# Patient Record
Sex: Female | Born: 1950 | Race: White | Hispanic: No | Marital: Married | Smoking: Never smoker
Health system: Southern US, Community
[De-identification: ages and names within clinical notes are randomized; demographics above are authoritative.]

## PROBLEM LIST (undated history)

## (undated) DIAGNOSIS — R1310 Dysphagia, unspecified: Secondary | ICD-10-CM

## (undated) DIAGNOSIS — D649 Anemia, unspecified: Secondary | ICD-10-CM

## (undated) DIAGNOSIS — K219 Gastro-esophageal reflux disease without esophagitis: Secondary | ICD-10-CM

## (undated) DIAGNOSIS — Z9889 Other specified postprocedural states: Secondary | ICD-10-CM

## (undated) DIAGNOSIS — F329 Major depressive disorder, single episode, unspecified: Secondary | ICD-10-CM

## (undated) DIAGNOSIS — R112 Nausea with vomiting, unspecified: Secondary | ICD-10-CM

## (undated) DIAGNOSIS — E7800 Pure hypercholesterolemia, unspecified: Secondary | ICD-10-CM

## (undated) DIAGNOSIS — M797 Fibromyalgia: Secondary | ICD-10-CM

## (undated) DIAGNOSIS — M1990 Unspecified osteoarthritis, unspecified site: Secondary | ICD-10-CM

## (undated) DIAGNOSIS — H269 Unspecified cataract: Secondary | ICD-10-CM

## (undated) DIAGNOSIS — Z5189 Encounter for other specified aftercare: Secondary | ICD-10-CM

## (undated) DIAGNOSIS — F32A Depression, unspecified: Secondary | ICD-10-CM

## (undated) DIAGNOSIS — K589 Irritable bowel syndrome without diarrhea: Secondary | ICD-10-CM

## (undated) DIAGNOSIS — K5790 Diverticulosis of intestine, part unspecified, without perforation or abscess without bleeding: Secondary | ICD-10-CM

## (undated) DIAGNOSIS — C801 Malignant (primary) neoplasm, unspecified: Secondary | ICD-10-CM

## (undated) DIAGNOSIS — K259 Gastric ulcer, unspecified as acute or chronic, without hemorrhage or perforation: Secondary | ICD-10-CM

## (undated) HISTORY — DX: Encounter for other specified aftercare: Z51.89

## (undated) HISTORY — DX: Pure hypercholesterolemia, unspecified: E78.00

## (undated) HISTORY — DX: Unspecified cataract: H26.9

## (undated) HISTORY — DX: Unspecified osteoarthritis, unspecified site: M19.90

## (undated) HISTORY — DX: Depression, unspecified: F32.A

## (undated) HISTORY — DX: Anemia, unspecified: D64.9

## (undated) HISTORY — PX: DILATION AND CURETTAGE OF UTERUS: SHX78

## (undated) HISTORY — PX: FOOT SURGERY: SHX648

## (undated) HISTORY — DX: Fibromyalgia: M79.7

## (undated) HISTORY — DX: Malignant (primary) neoplasm, unspecified: C80.1

## (undated) HISTORY — DX: Gastro-esophageal reflux disease without esophagitis: K21.9

## (undated) HISTORY — DX: Dysphagia, unspecified: R13.10

## (undated) HISTORY — DX: Irritable bowel syndrome, unspecified: K58.9

## (undated) HISTORY — DX: Gastric ulcer, unspecified as acute or chronic, without hemorrhage or perforation: K25.9

## (undated) HISTORY — PX: BLEPHAROPLASTY: SUR158

## (undated) HISTORY — DX: Diverticulosis of intestine, part unspecified, without perforation or abscess without bleeding: K57.90

---
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
## 2008-09-20 HISTORY — PX: COLONOSCOPY: SHX174

## 2016-02-24 ENCOUNTER — Encounter (INDEPENDENT_AMBULATORY_CARE_PROVIDER_SITE_OTHER): Payer: Self-pay | Admitting: Internal Medicine

## 2016-02-27 ENCOUNTER — Ambulatory Visit (INDEPENDENT_AMBULATORY_CARE_PROVIDER_SITE_OTHER): Payer: Medicare Other | Admitting: Internal Medicine

## 2016-02-27 ENCOUNTER — Encounter (INDEPENDENT_AMBULATORY_CARE_PROVIDER_SITE_OTHER): Payer: Self-pay | Admitting: Internal Medicine

## 2016-02-27 ENCOUNTER — Encounter (INDEPENDENT_AMBULATORY_CARE_PROVIDER_SITE_OTHER): Payer: Self-pay | Admitting: *Deleted

## 2016-02-27 ENCOUNTER — Other Ambulatory Visit (INDEPENDENT_AMBULATORY_CARE_PROVIDER_SITE_OTHER): Payer: Self-pay | Admitting: Internal Medicine

## 2016-02-27 VITALS — BP 112/60 | HR 64 | Temp 98.0°F | Ht 62.0 in | Wt 140.2 lb

## 2016-02-27 DIAGNOSIS — R1310 Dysphagia, unspecified: Secondary | ICD-10-CM

## 2016-02-27 DIAGNOSIS — M1990 Unspecified osteoarthritis, unspecified site: Secondary | ICD-10-CM | POA: Insufficient documentation

## 2016-02-27 NOTE — Progress Notes (Addendum)
   Subjective:    Patient ID: Shelby Andersen, female    DOB: 1951-08-11, 65 y.o.   MRN: [id number]  HPI Referred by [facility] for dysphagia.  She tells me in January she had a sinus infection and ever since she says she feels a glob in her upper esophagus. She says she choke very easy. If she swallows a pill, she massages her throat.  Her appetite is good. She can eat anything she wants. She has no problems eating meats . 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 Rule out esophageal web verus hypertonic cricopharyngeus muscle. 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 Correlate with upper endoscopy.  Hx of prior EGD/ED in the past by Dr. West Carbo per records. Her last colonoscopy was in 2010 and was normal by Dr .West Carbo.   02/04/2016 Laryngoscopy: Normal.   08/08/2009 Colonoscopy: Screening: Dr. West Carbo: Normal.   Review of Systems Past Medical History  Diagnosis Date  . Osteoarthritis   . Dysphagia     Past Surgical History  Procedure Laterality Date  . Dilation and curettage of uterus    . Foot surgery      Allergies  Allergen Reactions  . Codeine     itching    No current outpatient prescriptions on file prior to visit.   No current facility-administered medications on file prior to visit.   Current Outpatient Prescriptions  Medication Sig Dispense Refill  . atorvastatin (LIPITOR) 20 MG tablet Take 20 mg by mouth daily.    . citalopram (CELEXA) 40 MG tablet Take 40 mg by mouth daily.    . cyclobenzaprine (FLEXERIL) 10 MG tablet Take 10 mg by mouth daily.    . meloxicam (MOBIC) 15 MG tablet Take 15 mg by mouth daily.    . pantoprazole (PROTONIX) 40 MG tablet Take 40 mg by mouth daily.    . predniSONE (DELTASONE) 5 MG tablet Take 5 mg by mouth daily with breakfast.    . tolterodine (DETROL) 2 MG tablet Take 2 mg by mouth 2 (two) times daily.    . traZODone (DESYREL) 50 MG tablet Take  50 mg by mouth at bedtime.     No current facility-administered medications for this visit.   Mybertriq daily.      Objective:   Physical Exam Blood pressure 112/60, pulse 64, temperature 98 F (36.7 C), height 5\' 2"  (1.575 m), weight 140 lb 3.2 oz (63.594 kg). Alert and oriented. Skin warm and dry. Oral mucosa is moist.   . Sclera anicteric, conjunctivae is pink. Thyroid not enlarged. No cervical lymphadenopathy. Lungs clear. Heart regular rate and rhythm.  Abdomen is soft. Bowel sounds are positive. No hepatomegaly. No abdominal masses felt. No tenderness.  No edema to lower extremities. Patient is alert and oriented.        Assessment & Plan:  Dysphagia. Nees EGD/ED. Possible webb

## 2016-02-27 NOTE — Patient Instructions (Signed)
The risks and benefits such as perforation, bleeding, and infection were reviewed with the patient and is agreeable. 

## 2016-03-01 ENCOUNTER — Encounter (INDEPENDENT_AMBULATORY_CARE_PROVIDER_SITE_OTHER): Payer: Self-pay

## 2016-04-14 DIAGNOSIS — M797 Fibromyalgia: Secondary | ICD-10-CM | POA: Insufficient documentation

## 2016-04-21 ENCOUNTER — Encounter (HOSPITAL_COMMUNITY): Payer: Self-pay | Admitting: *Deleted

## 2016-04-21 ENCOUNTER — Encounter (HOSPITAL_COMMUNITY)
Admission: RE | Disposition: A | Discharge status: Home / Self Care | Payer: Self-pay | Source: Ambulatory Visit | Attending: Internal Medicine

## 2016-04-21 ENCOUNTER — Ambulatory Visit (HOSPITAL_COMMUNITY)
Admission: RE | Admit: 2016-04-21 | Discharge: 2016-04-21 | Disposition: A | Discharge status: Home / Self Care | Payer: Medicare Other | Source: Ambulatory Visit | Attending: Internal Medicine | Admitting: Internal Medicine

## 2016-04-21 DIAGNOSIS — Z791 Long term (current) use of non-steroidal anti-inflammatories (NSAID): Secondary | ICD-10-CM | POA: Diagnosis not present

## 2016-04-21 DIAGNOSIS — R1314 Dysphagia, pharyngoesophageal phase: Secondary | ICD-10-CM | POA: Diagnosis not present

## 2016-04-21 DIAGNOSIS — R1310 Dysphagia, unspecified: Secondary | ICD-10-CM | POA: Insufficient documentation

## 2016-04-21 DIAGNOSIS — K449 Diaphragmatic hernia without obstruction or gangrene: Secondary | ICD-10-CM | POA: Diagnosis not present

## 2016-04-21 DIAGNOSIS — Z79899 Other long term (current) drug therapy: Secondary | ICD-10-CM | POA: Diagnosis not present

## 2016-04-21 DIAGNOSIS — K319 Disease of stomach and duodenum, unspecified: Secondary | ICD-10-CM | POA: Insufficient documentation

## 2016-04-21 DIAGNOSIS — M1990 Unspecified osteoarthritis, unspecified site: Secondary | ICD-10-CM | POA: Insufficient documentation

## 2016-04-21 DIAGNOSIS — K219 Gastro-esophageal reflux disease without esophagitis: Secondary | ICD-10-CM | POA: Diagnosis not present

## 2016-04-21 DIAGNOSIS — K3189 Other diseases of stomach and duodenum: Secondary | ICD-10-CM | POA: Diagnosis not present

## 2016-04-21 HISTORY — DX: Other specified postprocedural states: Z98.890

## 2016-04-21 HISTORY — PX: ESOPHAGOGASTRODUODENOSCOPY: SHX5428

## 2016-04-21 HISTORY — DX: Other specified postprocedural states: R11.2

## 2016-04-21 HISTORY — PX: ESOPHAGEAL DILATION: SHX303

## 2016-04-21 SURGERY — EGD (ESOPHAGOGASTRODUODENOSCOPY)
Anesthesia: Moderate Sedation

## 2016-04-21 MED ORDER — SIMETHICONE 40 MG/0.6ML PO SUSP
ORAL | Status: DC | PRN
  Administered 2016-04-21: 2.5 mL

## 2016-04-21 MED ORDER — BUTAMBEN-TETRACAINE-BENZOCAINE 2-2-14 % EX AERO
INHALATION_SPRAY | CUTANEOUS | Status: DC | PRN
  Administered 2016-04-21: 2 via TOPICAL

## 2016-04-21 MED ORDER — MEPERIDINE HCL 50 MG/ML IJ SOLN
INTRAMUSCULAR | Status: DC
Start: 2016-04-21 — End: 2016-04-21
  Filled 2016-04-21: qty 1

## 2016-04-21 MED ORDER — MIDAZOLAM HCL 5 MG/5ML IJ SOLN
INTRAMUSCULAR | Status: AC
  Filled 2016-04-21: qty 10

## 2016-04-21 MED ORDER — MEPERIDINE HCL 50 MG/ML IJ SOLN
INTRAMUSCULAR | Status: DC | PRN
  Administered 2016-04-21 (×2): 25 mg via INTRAVENOUS

## 2016-04-21 MED ORDER — SODIUM CHLORIDE 0.9 % IV SOLN
INTRAVENOUS | Status: DC
  Administered 2016-04-21: 1000 mL via INTRAVENOUS

## 2016-04-21 MED ORDER — MIDAZOLAM HCL 5 MG/5ML IJ SOLN
INTRAMUSCULAR | Status: DC | PRN
  Administered 2016-04-21 (×2): 1 mg via INTRAVENOUS
  Administered 2016-04-21 (×2): 2 mg via INTRAVENOUS

## 2016-04-21 NOTE — Discharge Instructions (Signed)
No aspirin for 24 hours. Resume usual medications and diet as before. No driving for 24 hours. Physician will call with biopsy results.       Esophagogastroduodenoscopy, Care After Refer to this sheet in the next few weeks. These instructions provide you with information about caring for yourself after your procedure. Your health care provider may also give you more specific instructions. Your treatment has been planned according to current medical practices, but problems sometimes occur. Call your health care provider if you have any problems or questions after your procedure. WHAT TO EXPECT AFTER THE PROCEDURE After your procedure, it is typical to feel:  Soreness in your throat.  Pain with swallowing.  Sick to your stomach (nauseous).  Bloated.  Dizzy.  Fatigued. HOME CARE INSTRUCTIONS  Do not eat or drink anything until the numbing medicine (local anesthetic) has worn off and your gag reflex has returned. You will know that the local anesthetic has worn off when you can swallow comfortably.  Do not drive or operate machinery until directed by your health care provider.  Take medicines only as directed by your health care provider. SEEK MEDICAL CARE IF:   You cannot stop coughing.  You are not urinating at all or less than usual. SEEK IMMEDIATE MEDICAL CARE IF:  You have difficulty swallowing.  You cannot eat or drink.  You have worsening throat or chest pain.  You have dizziness or lightheadedness or you faint.  You have nausea or vomiting.  You have chills.  You have a fever.  You have severe abdominal pain.  You have black, tarry, or bloody stools.   This information is not intended to replace advice given to you by your health care provider. Make sure you discuss any questions you have with your health care provider.   Document Released: 08/23/2012 Document Revised: 09/27/2014 Document Reviewed: 08/23/2012 Elsevier Interactive Patient Education  [employer].

## 2016-04-21 NOTE — Op Note (Addendum)
[facility] Patient Name: Shelby Andersen Procedure Date: 04/21/2016 2:16 PM MRN: [id number] Date of Birth: 15-Jul-1951 Attending MD: Hildred Laser , MD CSN: [id number] Age: 65 Admit Type: Outpatient Procedure:                Upper GI endoscopy Indications:              Esophageal dysphagia, Gastro-esophageal reflux                            disease Providers:                Hildred Laser, MD, Lurline Del, RN, Purcell Nails.                            Tina Griffiths, Technician Referring MD:             Ms. Marval Regal Ronn Melena, NP. [facility]                            [facility] Medicines:                Cetacaine spray, Meperidine 50 mg IV, Midazolam 6                            mg IV Complications:            No immediate complications. Estimated Blood Loss:     Estimated blood loss was minimal. Procedure:                Pre-Anesthesia Assessment:                           - Prior to the procedure, a History and Physical                            was performed, and patient medications and                            allergies were reviewed. The patient's tolerance of                            previous anesthesia was also reviewed. The risks                            and benefits of the procedure and the sedation                            options and risks were discussed with the patient.                            All questions were answered, and informed consent                            was obtained. Prior Anticoagulants: The patient                            last took previous  NSAID medication 1 day prior to                            the procedure. ASA Grade Assessment: II - A patient                            with mild systemic disease. After reviewing the                            risks and benefits, the patient was deemed in                            satisfactory condition to undergo the procedure.                           After obtaining informed consent, the  endoscope was                            passed under direct vision. Throughout the                            procedure, the patient's blood pressure, pulse, and                            oxygen saturations were monitored continuously. The                            [id number]) scope was introduced through the                            mouth, and advanced to the second part of duodenum.                            The upper GI endoscopy was accomplished without                            difficulty. The patient tolerated the procedure                            well. Scope In: 2:29:49 PM Scope Out: 2:44:34 PM Total Procedure Duration: 0 hours 14 minutes 45 seconds  Findings:      The Z-line was regular and was found 34 cm from the incisors.      A 2 cm hiatal hernia was present.      No endoscopic abnormality was evident in the esophagus to explain the       patient's complaint of dysphagia. It was decided, however, to proceed       with dilation of the entire esophagus. The dilation site was examined       following endoscope reinsertion and showed no change and no bleeding,       mucosal tear or perforation. Biopsies were taken with a cold forceps for       histology.      A few, small non-bleeding erosions were found in the prepyloric region  of the stomach. There were no stigmata of recent bleeding.      The exam of the stomach was otherwise normal.      The duodenal bulb and second portion of the duodenum were normal.      No endoscopic abnormality was evident in the esophagus to explain the       patient's complaint of dysphagia. It was decided, however, to proceed       with dilation of the entire esophagus. The scope was withdrawn. Dilation       was performed with a Maloney dilator with mild resistance at 82 Fr. The       dilation site was examined and showed no change. Impression:               - Z-line regular, 34 cm from the incisors.                            - 2 cm hiatal hernia.                           - No endoscopic esophageal abnormality to explain                            patient's dysphagia. Esophagus dilated. Biopsied.                           - Non-bleeding erosive gastropathy possibly                            secondary to meloxicam.                           - Normal duodenal bulb and second portion of the                            duodenum.                           comment: If esophageal biopsy is negative for                            eosinophilophagitis and she remains with dysphagia                            will proceed with further evaluation. Moderate Sedation:      Moderate (conscious) sedation was administered by the endoscopy nurse       and supervised by the endoscopist. The following parameters were       monitored: oxygen saturation, heart rate, blood pressure, CO2       capnography and response to care. Total physician intraservice time was       20 minutes. Recommendation:           - Patient has a contact number available for                            emergencies. The signs and symptoms of potential  delayed complications were discussed with the                            patient. Return to normal activities tomorrow.                            Written discharge instructions were provided to the                            patient.                           - Resume previous diet today.                           - Continue present medications.                           - Resume previous NSAID medication at prior dose                            tomorrow.                           - Await pathology results. Procedure Code(s):        --- Professional ---                           [phone], Esophagogastroduodenoscopy, flexible,                            transoral; with biopsy, single or multiple                           99152, Moderate sedation services provided by the                             same physician or other qualified health care                            professional performing the diagnostic or                            therapeutic service that the sedation supports,                            requiring the presence of an independent trained                            observer to assist in the monitoring of the                            patient's level of consciousness and physiological                            status; initial 15 minutes of intraservice time,  patient age 55 years or older Diagnosis Code(s):        --- Professional ---                           K44.9, Diaphragmatic hernia without obstruction or                            gangrene                           K31.89, Other diseases of stomach and duodenum                           R13.14, Dysphagia, pharyngoesophageal phase                           K21.9, Gastro-esophageal reflux disease without                            esophagitis CPT copyright 2016 American Medical Association. All rights reserved. The codes documented in this report are preliminary and upon coder review may  be revised to meet current compliance requirements. Hildred Laser, MD Hildred Laser, MD 04/21/2016 3:03:13 PM This report has been signed electronically. Number of Addenda: 0

## 2016-04-21 NOTE — H&P (Signed)
Shelby Andersen is an 65 y.o. female.   Chief Complaint: Patient is here for EGD and ED. HPI: Patient is 65 year old Caucasian female was chronic GERD who now presents with dysphagia to solids and pills and occasionally liquids. She states her husband did Heimlich maneuver once and it worked. Heartburn is well controlled with PPI. She may have an episode every now and then. She denies nausea vomiting abdominal pain or melena. Last EGD was 7 years ago and provider relief for several months if not years.  Past Medical History:  Diagnosis Date  . Dysphagia   . Osteoarthritis   . PONV (postoperative nausea and vomiting)     Past Surgical History:  Procedure Laterality Date  . DILATION AND CURETTAGE OF UTERUS    . FOOT SURGERY      History reviewed. No pertinent family history. Social History:  reports that she has never smoked. She has never used smokeless tobacco. She reports that she drinks alcohol. She reports that she does not use drugs.  Allergies:  Allergies  Allergen Reactions  . Codeine     itching    Medications Prior to Admission  Medication Sig Dispense Refill  . atorvastatin (LIPITOR) 20 MG tablet Take 20 mg by mouth daily.    . Cholecalciferol (VITAMIN D PO) Take 1 tablet by mouth daily.    . citalopram (CELEXA) 40 MG tablet Take 40 mg by mouth daily.    . cyclobenzaprine (FLEXERIL) 10 MG tablet Take 10 mg by mouth daily.    Marland Kitchen docusate sodium (COLACE) 100 MG capsule Take 100 mg by mouth daily.    Marland Kitchen L-LYSINE PO Take 1 capsule by mouth daily.    . meloxicam (MOBIC) 15 MG tablet Take 15 mg by mouth daily.    . mirabegron ER (MYRBETRIQ) 25 MG TB24 tablet Take 25 mg by mouth daily.    . Multiple Vitamins-Minerals (MULTIVITAMINS THER. W/MINERALS) TABS tablet Take 1 tablet by mouth daily.    . pantoprazole (PROTONIX) 40 MG tablet Take 40 mg by mouth daily.    . predniSONE (DELTASONE) 5 MG tablet Take 5 mg by mouth. Every 3rd day    . tolterodine (DETROL) 2 MG tablet Take 2  mg by mouth at bedtime.     . traZODone (DESYREL) 50 MG tablet Take 50 mg by mouth at bedtime.      No results found for this or any previous visit (from the past 48 hour(s)). No results found.  ROS  Blood pressure 135/76, pulse 73, temperature 98.3 F (36.8 C), temperature source Oral, resp. rate 14, height 5\' 2"  (1.575 m), weight 139 lb (63 kg), SpO2 100 %. Physical Exam  Constitutional: She appears well-developed and well-nourished.  HENT:  Mouth/Throat: Oropharynx is clear and moist.  Eyes: Conjunctivae are normal. No scleral icterus.  Neck: No thyromegaly present.  Cardiovascular: Normal rate, regular rhythm and normal heart sounds.   No murmur heard. Respiratory: Effort normal and breath sounds normal.  GI: Soft. She exhibits no distension and no mass. There is no tenderness.  Musculoskeletal: She exhibits no edema.  Lymphadenopathy:    She has no cervical adenopathy.  Neurological: She is alert.  Skin: Skin is warm and dry.     Assessment/Plan Dysphagia to solids and pills in a patient with chronic GERD. EGD with ED.  Hildred Laser, MD 04/21/2016, 2:19 PM

## 2016-04-23 NOTE — Progress Notes (Signed)
Postoperative follow up call to patient.  C/o "Wednesday night after procedure, I had severe indigestion, pain that radiated to my back.  None today.  Feeling much better".  Patient did not report to Dr. Laural Golden or MD oncall.  Dr. Laural Golden notified.

## 2016-04-23 NOTE — Progress Notes (Signed)
Patient reassured after discussing with Dr. Laural Golden.  Patient advised per Dr. Laural Golden, after dilation reflux can be a little worse.  She will monitor symptoms. If recurs, patient will call and report.

## 2016-05-05 ENCOUNTER — Encounter (HOSPITAL_COMMUNITY): Payer: Self-pay | Admitting: Internal Medicine

## 2016-05-20 ENCOUNTER — Other Ambulatory Visit (INDEPENDENT_AMBULATORY_CARE_PROVIDER_SITE_OTHER): Payer: Self-pay | Admitting: *Deleted

## 2016-05-21 ENCOUNTER — Other Ambulatory Visit (INDEPENDENT_AMBULATORY_CARE_PROVIDER_SITE_OTHER): Payer: Self-pay | Admitting: *Deleted

## 2016-05-21 DIAGNOSIS — K2960 Other gastritis without bleeding: Secondary | ICD-10-CM

## 2016-11-01 ENCOUNTER — Telehealth (INDEPENDENT_AMBULATORY_CARE_PROVIDER_SITE_OTHER): Payer: Self-pay | Admitting: Internal Medicine

## 2016-11-01 NOTE — Telephone Encounter (Signed)
Patient called, stated that Dr. Laural Golden did an endoscopy a few months ago, he wanted her to be checked for h pylori.  She stated that she finally got in with a new primary doctor and they did the stool test, but will not give her results, said they sent them to Dr. Laural Golden and that he'll have to give her those results.  [phone] or [phone]

## 2016-11-02 NOTE — Telephone Encounter (Signed)
Dr.Rehman was mae aware. He ask that we get the results form her PCP. Stool was checked for H- pylori.

## 2016-11-04 NOTE — Telephone Encounter (Signed)
I was able to get the results faxed to [place] yesterday and I have them to [place].

## 2016-11-16 ENCOUNTER — Encounter (INDEPENDENT_AMBULATORY_CARE_PROVIDER_SITE_OTHER): Payer: Self-pay

## 2017-07-22 ENCOUNTER — Ambulatory Visit (INDEPENDENT_AMBULATORY_CARE_PROVIDER_SITE_OTHER): Payer: Medicare Other | Admitting: Internal Medicine

## 2017-07-22 ENCOUNTER — Encounter (INDEPENDENT_AMBULATORY_CARE_PROVIDER_SITE_OTHER): Payer: Self-pay | Admitting: Internal Medicine

## 2017-07-22 ENCOUNTER — Encounter (INDEPENDENT_AMBULATORY_CARE_PROVIDER_SITE_OTHER): Payer: Self-pay

## 2017-07-22 VITALS — BP 122/62 | HR 64 | Temp 97.9°F | Ht 62.5 in | Wt 133.7 lb

## 2017-07-22 DIAGNOSIS — R197 Diarrhea, unspecified: Secondary | ICD-10-CM

## 2017-07-22 MED ORDER — METRONIDAZOLE 500 MG PO TABS: 500.0000 mg | ORAL_TABLET | Freq: Two times a day (BID) | ORAL | 0 refills | Status: DC

## 2017-07-22 NOTE — Progress Notes (Signed)
Subjective:    Patient ID: Shelby Andersen, female    DOB: [birth year], 66 y.o.   MRN: [id number]  HPI Here today for f/u. Last seen in 2017 by me. Her last colonoscopy was in 2010 (screening) by Dr. West Carbo and was normal.  She tells me she is having 10-12 stools a day. She says she has not had a normal stool in the past 3 months.  She is wearing depends. All of her stools are loose. Has been taking Pepto Bismol as needed. She has seen blood when she wiped.  Her stools are blackish-green. No recent antibiotics. No fever.  She says she aches all over at times.   Underwent and EGD in August of 2017 for dyshagia: Impression:               - Z-line regular, 34 cm from the incisors.                           - 2 cm hiatal hernia.                           - No endoscopic esophageal abnormality to explain                            patient's dysphagia. Esophagus dilated. Biopsied.                           - Non-bleeding erosive gastropathy possibly                            secondary to meloxicam.                           - Normal duodenal bulb and second portion of the                            duodenum.                           comment: If esophageal biopsy is negative for                            eosinophilophagitis and she remains with dysphagia                            will proceed with further evaluation. Biopsy negative for EOE.   Review of Systems Past Medical History:  Diagnosis Date  . Dysphagia   . Osteoarthritis   . PONV (postoperative nausea and vomiting)     Past Surgical History:  Procedure Laterality Date  . DILATION AND CURETTAGE OF UTERUS    . ESOPHAGEAL DILATION N/A 04/21/2016   Procedure: ESOPHAGEAL DILATION;  Surgeon: Rogene Houston, MD;  Location: [facility];  Service: Endoscopy;  Laterality: N/A;  . ESOPHAGOGASTRODUODENOSCOPY N/A 04/21/2016   Procedure: ESOPHAGOGASTRODUODENOSCOPY (EGD);  Surgeon: Rogene Houston, MD;  Location: [facility] SU[facility];   Service: Endoscopy;  Laterality: N/A;  2:25  . FOOT SURGERY      Allergies  Allergen Reactions  . Codeine     itching    Current  Outpatient Prescriptions on File Prior to Visit  Medication Sig Dispense Refill  . atorvastatin (LIPITOR) 20 MG tablet Take 20 mg by mouth daily.    . Cholecalciferol (VITAMIN D PO) Take 1 tablet by mouth daily.    . citalopram (CELEXA) 40 MG tablet Take 40 mg by mouth daily.    Marland Kitchen L-LYSINE PO Take 1 capsule by mouth daily.    . Multiple Vitamins-Minerals (MULTIVITAMINS THER. W/MINERALS) TABS tablet Take 1 tablet by mouth daily.    . pantoprazole (PROTONIX) 40 MG tablet Take 40 mg by mouth daily.    Marland Kitchen tolterodine (DETROL) 2 MG tablet Take 2 mg by mouth at bedtime.     . traZODone (DESYREL) 50 MG tablet Take 50 mg by mouth at bedtime.    . meloxicam (MOBIC) 15 MG tablet Take 15 mg by mouth daily.     No current facility-administered medications on file prior to visit.         Objective:   Physical Exam Blood pressure 122/62, pulse 64, temperature 97.9 F (36.6 C), height 5' 2.5" (1.588 m), weight 133 lb 11.2 oz (60.6 kg). Alert and oriented. Skin warm and dry. Oral mucosa is moist.   . Sclera anicteric, conjunctivae is pink. Thyroid not enlarged. No cervical lymphadenopathy. Lungs clear. Heart regular rate and rhythm.  Abdomen is soft. Bowel sounds are positive. No hepatomegaly. No abdominal masses felt. No tenderness.  No edema to lower extremities.           Assessment & Plan:  Diarrhea. Will get a GI pathogen. Will start on Flagyl after the specimen. . Further recommendations to follow.

## 2017-07-22 NOTE — Patient Instructions (Addendum)
GI pathogen.  Rx for Flagyl sent to her pharmacy  

## 2017-07-25 LAB — GASTROINTESTINAL PATHOGEN PANEL PCR
C. DIFFICILE TOX A/B, PCR: NOT DETECTED
CAMPYLOBACTER, PCR: NOT DETECTED
CRYPTOSPORIDIUM, PCR: NOT DETECTED
E COLI (STEC) STX1/STX2, PCR: NOT DETECTED
E coli (ETEC) LT/ST PCR: NOT DETECTED
E coli 0157, PCR: NOT DETECTED
Giardia lamblia, PCR: NOT DETECTED
NOROVIRUS, PCR: NOT DETECTED
ROTAVIRUS, PCR: NOT DETECTED
SALMONELLA, PCR: NOT DETECTED
Shigella, PCR: NOT DETECTED

## 2017-08-01 ENCOUNTER — Telehealth (INDEPENDENT_AMBULATORY_CARE_PROVIDER_SITE_OTHER): Payer: Self-pay | Admitting: Internal Medicine

## 2017-08-01 DIAGNOSIS — R197 Diarrhea, unspecified: Secondary | ICD-10-CM

## 2017-08-01 MED ORDER — DICYCLOMINE HCL 10 MG PO CAPS: 10.0000 mg | ORAL_CAPSULE | Freq: Three times a day (TID) | ORAL | 2 refills | Status: DC

## 2017-08-01 NOTE — Telephone Encounter (Signed)
Rx for Dicyclomine 10mg  TID sent to her pharmacy. Stool studies ar negative.

## 2017-10-31 ENCOUNTER — Other Ambulatory Visit (INDEPENDENT_AMBULATORY_CARE_PROVIDER_SITE_OTHER): Payer: Self-pay | Admitting: Internal Medicine

## 2017-10-31 DIAGNOSIS — R197 Diarrhea, unspecified: Secondary | ICD-10-CM

## 2017-11-28 ENCOUNTER — Telehealth (INDEPENDENT_AMBULATORY_CARE_PROVIDER_SITE_OTHER): Payer: Self-pay | Admitting: Internal Medicine

## 2017-11-28 NOTE — Telephone Encounter (Signed)
She says her stools are formed now. Stools are small but formed.  She says the Dicyclomine has helped

## 2018-12-30 ENCOUNTER — Other Ambulatory Visit (INDEPENDENT_AMBULATORY_CARE_PROVIDER_SITE_OTHER): Payer: Self-pay | Admitting: Internal Medicine

## 2018-12-30 DIAGNOSIS — R197 Diarrhea, unspecified: Secondary | ICD-10-CM

## 2019-03-15 ENCOUNTER — Other Ambulatory Visit: Payer: Self-pay

## 2019-03-15 ENCOUNTER — Other Ambulatory Visit: Payer: Medicare Other

## 2019-03-15 DIAGNOSIS — Z20822 Contact with and (suspected) exposure to covid-19: Secondary | ICD-10-CM

## 2019-03-19 LAB — NOVEL CORONAVIRUS, NAA: SARS-CoV-2, NAA: NOT DETECTED

## 2019-03-27 ENCOUNTER — Telehealth: Payer: Self-pay | Admitting: Family Medicine

## 2019-03-27 NOTE — Telephone Encounter (Signed)
Advised patient her COVID 19 test results are negative.

## 2019-08-19 DIAGNOSIS — K589 Irritable bowel syndrome without diarrhea: Secondary | ICD-10-CM | POA: Insufficient documentation

## 2019-08-19 DIAGNOSIS — K279 Peptic ulcer, site unspecified, unspecified as acute or chronic, without hemorrhage or perforation: Secondary | ICD-10-CM | POA: Insufficient documentation

## 2019-09-07 ENCOUNTER — Encounter: Payer: Self-pay | Admitting: Neurology

## 2019-09-17 ENCOUNTER — Other Ambulatory Visit (INDEPENDENT_AMBULATORY_CARE_PROVIDER_SITE_OTHER): Payer: Medicare Other

## 2019-09-17 ENCOUNTER — Other Ambulatory Visit: Payer: Self-pay

## 2019-09-17 ENCOUNTER — Encounter: Payer: Self-pay | Admitting: Neurology

## 2019-09-17 ENCOUNTER — Ambulatory Visit (INDEPENDENT_AMBULATORY_CARE_PROVIDER_SITE_OTHER): Payer: Medicare Other | Admitting: Neurology

## 2019-09-17 VITALS — BP 130/79 | HR 68 | Ht 62.0 in | Wt 136.0 lb

## 2019-09-17 DIAGNOSIS — R413 Other amnesia: Secondary | ICD-10-CM | POA: Diagnosis not present

## 2019-09-17 DIAGNOSIS — R519 Headache, unspecified: Secondary | ICD-10-CM

## 2019-09-17 DIAGNOSIS — G9389 Other specified disorders of brain: Secondary | ICD-10-CM

## 2019-09-17 DIAGNOSIS — G4453 Primary thunderclap headache: Secondary | ICD-10-CM | POA: Diagnosis not present

## 2019-09-17 MED ORDER — SERTRALINE HCL 100 MG PO TABS: ORAL_TABLET | ORAL | 11 refills | Status: DC

## 2019-09-17 NOTE — Progress Notes (Signed)
NEUROLOGY CONSULTATION NOTE  Shelby Andersen MRN: [id number] DOB: 09-Jun-1951  Referring provider: Dr. Sherrilee Gilles Primary care provider: Dr. Sherrilee Gilles  Reason for consult:  Memory loss  Dear Dr Sallee Provencal:  Thank you for your kind referral of Shelby Andersen for consultation of the above symptoms. Although her history is well known to you, please allow me to reiterate it for the purpose of our medical record. The patient was accompanied to the clinic by her husband who also provides collateral information. Records and images were personally reviewed where available.   HISTORY OF PRESENT ILLNESS: This is a pleasant 68 year old right-handed woman with a history of hyperlipidemia, depression, presenting for evaluation of memory loss and headache. She started noticing memory changes a couple of years ago. She has been concerned that she goes totally blank while carrying on a conversation. She lives with her husband and manages finances without difficulties. She manages both their medications. She denies getting lost driving but has to think of where she is going. Her husband denies any driving concerns. She has not left the stove on. She misplaces her phone frequently. Her son has told her she has repeated the same thing 3 times. Her mother had dementia. No history of significant head injuries or alcohol use. Sleep is not so good, she has difficulty with sleep initiation, in addition she wakes up 3 times to use the bathroom or help her husband when sugar levels go low. She had reduced Trazodone to 25mg  qhs. She states she cries a lot. There have been several deaths in the family since 2016 and her husband has been ill. She cries every morning during her quiet time, missing her mother. Once she gets through it, she is fine the rest of the day. She has been on Sertraline 100mg  daily for the past 2 years. No paranoia or hallucinations. She had been on pantoprazole for GERD which she stopped due to concern  for causing memory loss, no improvement with discontinuation of medication.  She reports a history of 3 migraines in her adult life where she has light sensitivity and extreme nausea. On 07/06/2019, she woke up with excruciating pain different from typical headaches. She had pain on the vertex radiating down and around her head, worse in the occipital region with stiff neck. There was no nausea. She rates the pain as a "15/10" and called her friend to bring her to the hospital. She could not lay flat. Head CT was normal, morphine got the pain down. She states it has been 2 months but her head does not feel normal. There is a pressure around her head, with tenderness in the occipital regions. Neck is better after visiting her chiropractor. She states it is not pain now, just discomfort. No family history of cerebral aneurysm. She gets dizzy quickly with positional changes, such as laying back on bed. No diplopia, dysarthria/dysphagia, anosmia, tremors. She has osteoarthritis with a lot of pain in her left arm, left hip, and left calf. She has pain in her right hand and left wrist radiating to the thumb. No paresthesias. She reports 2 tick bites with a knot on her left leg, she has been told by an allergist that she was "borderline for chronic Lyme disease." She has low back pain worse in the morning. She has bladder incontinence. For the past 5 weeks, she has had diarrhea with 10-12 BM a day, occasional abdominal pain.    PAST MEDICAL HISTORY: Past Medical History:  Diagnosis Date  .  Dysphagia   . Osteoarthritis   . PONV (postoperative nausea and vomiting)     PAST SURGICAL HISTORY: Past Surgical History:  Procedure Laterality Date  . DILATION AND CURETTAGE OF UTERUS    . ESOPHAGEAL DILATION N/A 04/21/2016   Procedure: ESOPHAGEAL DILATION;  Surgeon: Rogene Houston, MD;  [facility];  Service: Endoscopy;  Laterality: N/A;  . ESOPHAGOGASTRODUODENOSCOPY N/A 04/21/2016   Procedure:  ESOPHAGOGASTRODUODENOSCOPY (EGD);  Surgeon: Rogene Houston, MD;  [facility];  Service: Endoscopy;  Laterality: N/A;  2:25  . FOOT SURGERY      MEDICATIONS: Current Outpatient Medications on File Prior to Visit  Medication Sig Dispense Refill  . acetaminophen (TYLENOL) 650 MG CR tablet Take 1,300 mg by mouth every 8 (eight) hours as needed for pain.    Marland Kitchen atorvastatin (LIPITOR) 20 MG tablet Take 20 mg by mouth daily.    . Cholecalciferol (VITAMIN D PO) Take 1 tablet by mouth daily.    . Coenzyme Q10 (COQ10) 200 MG CAPS Take by mouth daily.    Marland Kitchen dicyclomine (BENTYL) 10 MG capsule TAKE 1 CAPSULE BY MOUTH THREE TIMES A DAY BEFORE MEALS 270 capsule 5  . magnesium 30 MG tablet Take 30 mg by mouth 2 (two) times daily.    . Multiple Vitamins-Minerals (MULTIVITAMINS THER. W/MINERALS) TABS tablet Take 1 tablet by mouth daily.    . NON FORMULARY daily. [employer] Bladder Support    . sertraline (ZOLOFT) 100 MG tablet Take 100 mg by mouth daily.    . traZODone (DESYREL) 50 MG tablet Take 50 mg by mouth at bedtime.    . vitamin B-12 (CYANOCOBALAMIN) 1000 MCG tablet Take 3,000 mcg by mouth daily.     No current facility-administered medications on file prior to visit.    ALLERGIES: Allergies  Allergen Reactions  . Codeine     itching    FAMILY HISTORY: History reviewed. No pertinent family history.  SOCIAL HISTORY: Social History   Socioeconomic History  . Marital status: Married    Spouse name: Not on file  . Number of children: Not on file  . Years of education: Not on file  . Highest education level: Not on file  Occupational History  . Not on file  Tobacco Use  . Smoking status: Never Smoker  . Smokeless tobacco: Never Used  Substance and Sexual Activity  . Alcohol use: Yes    Alcohol/week: 0.0 standard drinks    Comment: rare  . Drug use: No  . Sexual activity: Not Currently    Partners: Male  Other Topics Concern  . Not on file  Social History Narrative   Right  handed      Lives with husband in two story home. Stays on first floor      2 years of college edu   Social Determinants of Health   Financial Resource Strain:   . Difficulty of Paying Living Expenses: Not on file  Food Insecurity:   . Worried About Charity fundraiser in the Last Year: Not on file  . Ran Out of Food in the Last Year: Not on file  Transportation Needs:   . Lack of Transportation (Medical): Not on file  . Lack of Transportation (Non-Medical): Not on file  Physical Activity:   . Days of Exercise per Week: Not on file  . Minutes of Exercise per Session: Not on file  Stress:   . Feeling of Stress : Not on file  Social Connections:   .  Frequency of Communication with Friends and Family: Not on file  . Frequency of Social Gatherings with Friends and Family: Not on file  . Attends Religious Services: Not on file  . Active Member of Clubs or Organizations: Not on file  . Attends Archivist Meetings: Not on file  . Marital Status: Not on file  Intimate Partner Violence:   . Fear of Current or Ex-Partner: Not on file  . Emotionally Abused: Not on file  . Physically Abused: Not on file  . Sexually Abused: Not on file    REVIEW OF SYSTEMS: Constitutional: No fevers, chills, or sweats, no generalized fatigue, change in appetite Eyes: No visual changes, double vision, eye pain Ear, nose and throat: No hearing loss, ear pain, nasal congestion, sore throat Cardiovascular: No chest pain, palpitations Respiratory:  No shortness of breath at rest or with exertion, wheezes GastrointestinaI: No nausea, vomiting, diarrhea, abdominal pain, fecal incontinence Genitourinary:  No dysuria, urinary retention or frequency Musculoskeletal:  No neck pain,+ back pain Integumentary: No rash, pruritus, skin lesions Neurological: as above Psychiatric: + depression, insomnia, anxiety Endocrine: No palpitations, fatigue, diaphoresis, mood swings, change in appetite, change in  weight, increased thirst Hematologic/Lymphatic:  No anemia, purpura, petechiae. Allergic/Immunologic: no itchy/runny eyes, nasal congestion, recent allergic reactions, rashes  PHYSICAL EXAM: Vitals:   09/17/19 1317  BP: 130/79  Pulse: 68  SpO2: 98%   General: No acute distress Head:  Normocephalic/atraumatic Skin/Extremities: No rash, no edema Neurological Exam: Mental status: alert and oriented to person, place, and time, no dysarthria or aphasia, Fund of knowledge is appropriate.  Recent and remote memory are intact.  Attention and concentration are normal.  SLUMS score 28/30 [employer] Mental Exam 09/17/2019  Weekday Correct 1  Current year 1  What state are we in? 1  Amount spent 1  Amount left 2  # of Animals 3  5 objects recall 5  Number series 2  Hour markers 2  Time correct 2  Placed X in triangle correctly 1  Largest Figure 1  Name of female 2  Date back to work 2  Type of work 2  State she lived in 0  Total score 28   Cranial nerves: CN I: not tested CN II: pupils equal, round and reactive to light, visual fields intact CN III, IV, VI:  full range of motion, no nystagmus, no ptosis CN V: facial sensation intact CN VII: upper and lower face symmetric CN VIII: hearing intact to conversation CN IX, X: gag intact, uvula midline CN XI: sternocleidomastoid and trapezius muscles intact CN XII: tongue midline Bulk & Tone: normal, no fasciculations. Motor: 5/5 throughout with no pronator drift. Sensation: intact to light touch, cold, pin, vibration and joint position sense.  No extinction to double simultaneous stimulation.  Romberg test negative Deep Tendon Reflexes: +2 throughout, no ankle clonus Plantar responses: downgoing bilaterally Cerebellar: no incoordination on finger to nose testing  Gait: narrow-based and steady, able to tandem walk adequately. Tremor: none  IMPRESSION: This is a pleasant 68 year old right-handed woman with a history of  hyperlipidemia, depression, presenting for evaluation of memory loss. Neurological exam is non-focal, SLUMS score today normal 28/30. We discussed different causes of memory loss. Check TSH and B12. We discussed how mood can affect memory, she reports crying every morning. We discussed increasing Sertraline to 150mg  daily and consideration for counseling, her husband does not think she needs it. The headache last October was the worst headache of her life,  she continues to have residual head pressure. MRI brain without contrast and MRA head without contrast will be ordered to assess for underlying structural abnormality. We discussed the importance of control of vascular risk factors, physical exercise, and brain stimulation exercises for brain health. Follow-up in 6 months, she knows to call for any changes.   Thank you for allowing me to participate in the care of this patient. Please do not hesitate to call for any questions or concerns.   Ellouise Newer, M.D.  CC: Dr. Sherrilee Gilles

## 2019-09-17 NOTE — Patient Instructions (Addendum)
1. Bloodwork for TSH, B12. Go the 2nd floor of our building in the Endocrinology office to have these drawn. 2. Schedule MRI brain without contrast, MRA head without contrast. These will be performed at [facility]. They will call you to schedule an appointment date and time. If needed their number is [phone]. 3. Increase sertraline 100mg : take 1 and 1/2 tablets daily 4. Follow-up in 6 months, call for any changes   RECOMMENDATIONS FOR ALL PATIENTS WITH MEMORY PROBLEMS: 1. Continue to exercise (Recommend 30 minutes of walking everyday, or 3 hours every week) 2. Increase social interactions - continue going to [place] and enjoy social gatherings with friends and family 3. Eat healthy, avoid fried foods and eat more fruits and vegetables 4. Maintain adequate blood pressure, blood sugar, and blood cholesterol level. Reducing the risk of stroke and cardiovascular disease also helps promoting better memory. 5. Avoid stressful situations. Live a simple life and avoid aggravations. Organize your time and prepare for the next day in anticipation. 6. Sleep well, avoid any interruptions of sleep and avoid any distractions in the bedroom that may interfere with adequate sleep quality 7. Avoid sugar, avoid sweets as there is a strong link between excessive sugar intake, diabetes, and cognitive impairment The Mediterranean diet has been shown to help patients reduce the risk of progressive memory disorders and reduces cardiovascular risk. This includes eating fish, eat fruits and green leafy vegetables, nuts like almonds and hazelnuts, walnuts, and also use olive oil. Avoid fast foods and fried foods as much as possible. Avoid sweets and sugar as sugar use has been linked to worsening of memory function.

## 2019-09-18 LAB — TSH: TSH: 3.57 u[IU]/mL (ref 0.450–4.500)

## 2019-09-18 LAB — VITAMIN B12: Vitamin B-12: >2000 pg/mL — ABNORMAL HIGH (ref 232–1245)

## 2019-09-19 ENCOUNTER — Encounter: Payer: Self-pay | Admitting: Neurology

## 2019-09-19 ENCOUNTER — Telehealth: Payer: Self-pay

## 2019-09-19 NOTE — Telephone Encounter (Signed)
Pt informed of lab results. No concerns at this time. 

## 2019-09-19 NOTE — Telephone Encounter (Signed)
-----   Message from Cameron Sprang, MD sent at 09/18/2019 12:33 PM EST ----- Pls let her know the bloodwork was normal, thanks

## 2019-09-29 ENCOUNTER — Ambulatory Visit
Admission: RE | Admit: 2019-09-29 | Discharge: 2019-09-29 | Disposition: A | Discharge status: Home / Self Care | Payer: Medicare Other | Source: Ambulatory Visit | Attending: Neurology | Admitting: Neurology

## 2019-09-29 ENCOUNTER — Other Ambulatory Visit: Payer: Self-pay

## 2019-09-29 DIAGNOSIS — R413 Other amnesia: Secondary | ICD-10-CM

## 2019-09-29 DIAGNOSIS — G4453 Primary thunderclap headache: Secondary | ICD-10-CM

## 2019-09-29 DIAGNOSIS — R519 Headache, unspecified: Secondary | ICD-10-CM

## 2019-10-02 ENCOUNTER — Telehealth: Payer: Self-pay

## 2019-10-02 NOTE — Telephone Encounter (Signed)
Pt informed of results. Pt unsure of what was causing the head pain. Believes it may be due to stress. Confirmed follow up appt with pt.

## 2019-10-02 NOTE — Telephone Encounter (Signed)
-----   Message from Cameron Sprang, MD sent at 10/01/2019  9:09 AM EST ----- Pls let her know the MRI brain is normal, no evidence of tumor, stroke, bleed, or aneurysm. It showed mild age-related changes. Thanks

## 2019-11-23 ENCOUNTER — Ambulatory Visit: Payer: Medicare Other | Admitting: Neurology

## 2019-12-13 ENCOUNTER — Encounter: Payer: Self-pay | Admitting: Gastroenterology

## 2020-01-14 DIAGNOSIS — F32A Depression, unspecified: Secondary | ICD-10-CM | POA: Insufficient documentation

## 2020-01-14 DIAGNOSIS — E785 Hyperlipidemia, unspecified: Secondary | ICD-10-CM | POA: Insufficient documentation

## 2020-01-14 DIAGNOSIS — F329 Major depressive disorder, single episode, unspecified: Secondary | ICD-10-CM | POA: Insufficient documentation

## 2020-01-16 ENCOUNTER — Other Ambulatory Visit: Payer: Self-pay

## 2020-01-16 ENCOUNTER — Encounter: Payer: Self-pay | Admitting: Gastroenterology

## 2020-01-16 ENCOUNTER — Ambulatory Visit (INDEPENDENT_AMBULATORY_CARE_PROVIDER_SITE_OTHER): Payer: Medicare Other | Admitting: Gastroenterology

## 2020-01-16 VITALS — BP 120/70 | HR 80 | Temp 97.9°F | Ht 62.0 in | Wt 137.1 lb

## 2020-01-16 DIAGNOSIS — R195 Other fecal abnormalities: Secondary | ICD-10-CM

## 2020-01-16 MED ORDER — SUPREP BOWEL PREP KIT 17.5-3.13-1.6 GM/177ML PO SOLN: 1.0000 | ORAL | 0 refills | Status: DC

## 2020-01-16 MED ORDER — LOPERAMIDE HCL 2 MG PO TABS: 2.0000 mg | ORAL_TABLET | Freq: Every day | ORAL | 0 refills | Status: DC

## 2020-01-16 NOTE — Progress Notes (Signed)
HPI: This is a very pleasant 69 year old woman who was referred to me by Sherrilee Gilles, DO  to evaluate chronic loose stools.    Since November, 5 or 6 months ago she has had significant change in her bowels.  She describes at first significant watery diarrhea that has been nonbloody.  She will go up to 15 times per day.  She cannot point to any specific medicine changes or certainly antibiotics that preceded this.  Probiotics have certainly helped but should her stools are not back to normal.  She goes 3-4 times per day lately, never formed, very mushy.  Stool testing through her primary care physician in [place] showed a C. difficile was negative, ova parasites was negative, stool culture I believe was negative however the report I see just says "see result" H. pylori stool testing was done and it was negative also.  She does not recall a similar type event for which she was seen by regional GI 3 to 4 years ago.  Old Data Reviewed: Long-term patient at [facility] practice.  She was last seen 2 and half years ago there.  The time she was having a lot of diarrhea.  The documented that she had had a screening colonoscopy in 2010 in [place] and it was normal.  They also documented that she underwent an EGD August 2017 for dysphagia.  Esophagus looked pretty normal, biopsies were negative for EOE.  Given her acute diarrhea GI pathogen panel was sent and she was started on Flagyl to begin after the specimen was dropped off.  Her stool testing was negative and so she was put on dicyclomine 10 mg to 3 times daily.  Sounds like dicyclomine helped  Blood work December 2020 shows normal TSH, B12 is very elevated greater than 2000  Review of systems: Pertinent positive and negative review of systems were noted in the above HPI section. All other review negative.   Past Medical History:  Diagnosis Date  . Anemia   . Depression   . Diverticulosis   . Dysphagia   . Elevated cholesterol   .  Fibromyalgia   . GERD (gastroesophageal reflux disease)   . IBS (irritable bowel syndrome)   . Multiple gastric ulcers   . Osteoarthritis   . PONV (postoperative nausea and vomiting)     Past Surgical History:  Procedure Laterality Date  . COLONOSCOPY  2010   [place]  . DILATION AND CURETTAGE OF UTERUS    . ESOPHAGEAL DILATION N/A 04/21/2016   Procedure: ESOPHAGEAL DILATION;  Surgeon: Rogene Houston, MD;  [facility];  Service: Endoscopy;  Laterality: N/A;  . ESOPHAGOGASTRODUODENOSCOPY N/A 04/21/2016   Procedure: ESOPHAGOGASTRODUODENOSCOPY (EGD);  Surgeon: Rogene Houston, MD;  [facility];  Service: Endoscopy;  Laterality: N/A;  2:25  . FOOT SURGERY      Current Outpatient Medications  Medication Sig Dispense Refill  . acetaminophen (TYLENOL) 650 MG CR tablet Take 1,300 mg by mouth every 8 (eight) hours as needed for pain.    . Cholecalciferol (VITAMIN D PO) Take 1 tablet by mouth daily.    . Coenzyme Q10 (COQ10) 200 MG CAPS Take by mouth daily.    Marland Kitchen dicyclomine (BENTYL) 10 MG capsule TAKE 1 CAPSULE BY MOUTH THREE TIMES A DAY BEFORE MEALS (Patient taking differently: PT RAN OUT OF MEDICATION APPROX 3 WEEKS AGO) 270 capsule 5  . Multiple Vitamins-Minerals (MULTIVITAMINS THER. W/MINERALS) TABS tablet Take 1 tablet by mouth daily.    Marland Kitchen NON  FORMULARY daily. [facility] Bladder Support    . pravastatin (PRAVACHOL) 20 MG tablet Take by mouth.    . sertraline (ZOLOFT) 100 MG tablet Take 1.5 tablets daily 45 tablet 11  . traZODone (DESYREL) 50 MG tablet Take 50 mg by mouth at bedtime.    . vitamin B-12 (CYANOCOBALAMIN) 1000 MCG tablet Take 3,000 mcg by mouth daily.     No current facility-administered medications for this visit.    Allergies as of 01/16/2020 - Review Complete 01/16/2020  Allergen Reaction Noted  . Codeine  02/27/2016    Family History  Problem Relation Age of Onset  . Heart disease Mother   . Heart disease Father   . Skin cancer Brother   . Colon  cancer Maternal Uncle     Social History   Socioeconomic History  . Marital status: Married    Spouse name: Not on file  . Number of children: 1  . Years of education: Not on file  . Highest education level: Not on file  Occupational History  . Occupation: retired  Tobacco Use  . Smoking status: Never Smoker  . Smokeless tobacco: Never Used  Substance and Sexual Activity  . Alcohol use: Yes    Alcohol/week: 0.0 standard drinks    Comment: rare  . Drug use: No  . Sexual activity: Not Currently    Partners: Male  Other Topics Concern  . Not on file  Social History Narrative   Right handed      Lives with husband in two story home. Stays on first floor      2 years of college edu   Social Determinants of Health   Financial Resource Strain:   . Difficulty of Paying Living Expenses:   Food Insecurity:   . Worried About Charity fundraiser in the Last Year:   . Arboriculturist in the Last Year:   Transportation Needs:   . Film/video editor (Medical):   Marland Kitchen Lack of Transportation (Non-Medical):   Physical Activity:   . Days of Exercise per Week:   . Minutes of Exercise per Session:   Stress:   . Feeling of Stress :   Social Connections:   . Frequency of Communication with Friends and Family:   . Frequency of Social Gatherings with Friends and Family:   . Attends Religious Services:   . Active Member of Clubs or Organizations:   . Attends Archivist Meetings:   Marland Kitchen Marital Status:   Intimate Partner Violence:   . Fear of Current or Ex-Partner:   . Emotionally Abused:   Marland Kitchen Physically Abused:   . Sexually Abused:      Physical Exam: BP 120/70   Pulse 80   Temp 97.9 F (36.6 C)   Ht 5\' 2"  (1.575 m)   Wt 137 lb 2 oz (62.2 kg)   BMI 25.08 kg/m  Constitutional: generally well-appearing Psychiatric: alert and oriented x3 Eyes: extraocular movements intact Mouth: oral pharynx moist, no lesions Neck: supple no lymphadenopathy Cardiovascular: heart  regular rate and rhythm Lungs: clear to auscultation bilaterally Abdomen: soft, nontender, nondistended, no obvious ascites, no peritoneal signs, normal bowel sounds Extremities: no lower extremity edema bilaterally Skin: no lesions on visible extremities   Assessment and plan: 69 y.o. female with change in bowels, chronic loose stools  GI stool testing by her primary care physician 2 months ago was negative.  Probiotics have certainly helped but she is not back to normal.  Her last colonoscopy  was 11 years ago.  I recommended colonoscopy for her change in bowel habits at her soonest convenience.  I would likely plan random colon biopsies if the examination was completely normal.  In the meantime she will start a trial of single dose daily scheduled Imodium for now.    Please see the "Patient Instructions" section for addition details about the plan.   Owens Loffler, MD [facility] 01/16/2020, 11:11 AM  Cc: Sherrilee Gilles, DO  Total time on date of encounter was 45  minutes (this included time spent preparing to see the patient reviewing records; obtaining and/or reviewing separately obtained history; performing a medically appropriate exam and/or evaluation; counseling and educating the patient and family if present; ordering medications, tests or procedures if applicable; and documenting clinical information in the health record).

## 2020-01-16 NOTE — Patient Instructions (Addendum)
If you are age 71 or older, your body mass index should be between 23-30. Your Body mass index is 25.08 kg/m. If this is out of the aforementioned range listed, please consider follow up with your Primary Care Provider.  If you are age 80 or younger, your body mass index should be between 19-25. Your Body mass index is 25.08 kg/m. If this is out of the aformentioned range listed, please consider follow up with your Primary Care Provider.   You have been scheduled for a colonoscopy. Please follow written instructions given to you at your visit today.  Please pick up your prep supplies at the pharmacy within the next 1-3 days. If you use inhalers (even only as needed), please bring them with you on the day of your procedure.  Please purchase the following medications over the counter and take as directed:  START: Imodium (over-the-counter) take 1 tablet every morning.  Thank you for entrusting me with your care and choosing [facility].  Dr Ardis Hughs

## 2020-03-07 ENCOUNTER — Encounter: Payer: Medicare Other | Admitting: Gastroenterology

## 2020-03-19 ENCOUNTER — Other Ambulatory Visit: Payer: Self-pay | Admitting: Gastroenterology

## 2020-03-19 ENCOUNTER — Other Ambulatory Visit: Payer: Self-pay

## 2020-03-19 ENCOUNTER — Ambulatory Visit (AMBULATORY_SURGERY_CENTER): Payer: Medicare Other | Admitting: Gastroenterology

## 2020-03-19 ENCOUNTER — Encounter: Payer: Self-pay | Admitting: Gastroenterology

## 2020-03-19 VITALS — BP 170/72 | HR 61 | Temp 97.5°F | Resp 18 | Ht 62.0 in | Wt 137.0 lb

## 2020-03-19 DIAGNOSIS — R195 Other fecal abnormalities: Secondary | ICD-10-CM

## 2020-03-19 DIAGNOSIS — K5730 Diverticulosis of large intestine without perforation or abscess without bleeding: Secondary | ICD-10-CM | POA: Diagnosis not present

## 2020-03-19 DIAGNOSIS — Z0389 Encounter for observation for other suspected diseases and conditions ruled out: Secondary | ICD-10-CM | POA: Diagnosis not present

## 2020-03-19 DIAGNOSIS — R197 Diarrhea, unspecified: Secondary | ICD-10-CM

## 2020-03-19 DIAGNOSIS — K529 Noninfective gastroenteritis and colitis, unspecified: Secondary | ICD-10-CM

## 2020-03-19 DIAGNOSIS — K52831 Collagenous colitis: Secondary | ICD-10-CM

## 2020-03-19 HISTORY — DX: Noninfective gastroenteritis and colitis, unspecified: K52.9

## 2020-03-19 MED ORDER — SODIUM CHLORIDE 0.9 % IV SOLN: 500.0000 mL | Freq: Once | INTRAVENOUS | Status: DC

## 2020-03-19 NOTE — Op Note (Signed)
[facility] Patient Name: Shelby Andersen Procedure Date: 03/19/2020 11:09 AM MRN: [id number] Endoscopist: Milus Banister , MD Age: 69 Referring MD:  Date of Birth: 1951/04/20 Gender: Female Account #: [id number] Procedure:                Colonoscopy Indications:              Change in bowel habits, loose stools for several                            months Medicines:                Monitored Anesthesia Care Procedure:                Pre-Anesthesia Assessment:                           - Prior to the procedure, a History and Physical                            was performed, and patient medications and                            allergies were reviewed. The patient's tolerance of                            previous anesthesia was also reviewed. The risks                            and benefits of the procedure and the sedation                            options and risks were discussed with the patient.                            All questions were answered, and informed consent                            was obtained. Prior Anticoagulants: The patient has                            taken no previous anticoagulant or antiplatelet                            agents. ASA Grade Assessment: II - A patient with                            mild systemic disease. After reviewing the risks                            and benefits, the patient was deemed in                            satisfactory condition to undergo the procedure.  After obtaining informed consent, the colonoscope                            was passed under direct vision. Throughout the                            procedure, the patient's blood pressure, pulse, and                            oxygen saturations were monitored continuously. The                            Colonoscope was introduced through the anus and                            advanced to the the terminal ileum. The colonoscopy                             was performed without difficulty. The patient                            tolerated the procedure well. The quality of the                            bowel preparation was good. The ileocecal valve,                            appendiceal orifice, and rectum were photographed. Scope In: 11:17:12 AM Scope Out: 11:30:52 AM Scope Withdrawal Time: 0 hours 5 minutes 58 seconds  Total Procedure Duration: 0 hours 13 minutes 40 seconds  Findings:                 The terminal ileum appeared normal.                           Multiple small and large-mouthed diverticula were                            found in the left colon.                           Biopsies for histology were taken with a cold                            forceps from the entire colon for evaluation of                            microscopic colitis.                           The exam was otherwise without abnormality on                            direct and retroflexion views. Complications:  No immediate complications. Estimated blood loss:                            None. Estimated Blood Loss:     Estimated blood loss: none. Impression:               - The examined portion of the ileum was normal.                           - Diverticulosis in the left colon.                           - The examination was otherwise normal on direct                            and retroflexion views.                           - Biopsies were taken with a cold forceps from the                            entire colon for evaluation of microscopic colitis. Recommendation:           - Patient has a contact number available for                            emergencies. The signs and symptoms of potential                            delayed complications were discussed with the                            patient. Return to normal activities tomorrow.                            Written discharge instructions were provided  to the                            patient.                           - Resume previous diet.                           - Continue present medications. For now continue                            taking the single imodium once daily since it seems                            to be helping quite well.                           - Await pathology results. Milus Banister, MD 03/19/2020 11:37:42 AM This report has been signed electronically.

## 2020-03-19 NOTE — Progress Notes (Signed)
No problems noted in the recovery room. maw 

## 2020-03-19 NOTE — Progress Notes (Signed)
Called to room to assist during endoscopic procedure.  Patient ID and intended procedure confirmed with present staff. Received instructions for my participation in the procedure from the performing physician.  

## 2020-03-19 NOTE — Progress Notes (Signed)
PT taken to PACU. Monitors in place. VSS. Report given to RN. 

## 2020-03-19 NOTE — Progress Notes (Signed)
Vitals-CW  Pt's states no medical or surgical changes since previsit or office visit. 

## 2020-03-19 NOTE — Patient Instructions (Addendum)
Handout was given to you on diverticulosis. You may resume your current medications today. Await biopsy results. Please call if any questions or concerns.     YOU HAD AN ENDOSCOPIC PROCEDURE TODAY AT [facility]:   Refer to the procedure report that was given to you for any specific questions about what was found during the examination.  If the procedure report does not answer your questions, please call your gastroenterologist to clarify.  If you requested that your care partner not be given the details of your procedure findings, then the procedure report has been included in a sealed envelope for you to review at your convenience later.  YOU SHOULD EXPECT: Some feelings of bloating in the abdomen. Passage of more gas than usual.  Walking can help get rid of the air that was put into your GI tract during the procedure and reduce the bloating. If you had a lower endoscopy (such as a colonoscopy or flexible sigmoidoscopy) you may notice spotting of blood in your stool or on the toilet paper. If you underwent a bowel prep for your procedure, you may not have a normal bowel movement for a few days.  Please Note:  You might notice some irritation and congestion in your nose or some drainage.  This is from the oxygen used during your procedure.  There is no need for concern and it should clear up in a day or so.  SYMPTOMS TO REPORT IMMEDIATELY:   Following lower endoscopy (colonoscopy or flexible sigmoidoscopy):  Excessive amounts of blood in the stool  Significant tenderness or worsening of abdominal pains  Swelling of the abdomen that is new, acute  Fever of 100F or higher  For urgent or emergent issues, a gastroenterologist can be reached at any hour by calling [phone]. Do not use MyChart messaging for urgent concerns.    DIET:  We do recommend a small meal at first, but then you may proceed to your regular diet.  Drink plenty of fluids but you should avoid  alcoholic beverages for 24 hours.  ACTIVITY:  You should plan to take it easy for the rest of today and you should NOT DRIVE or use heavy machinery until tomorrow (because of the sedation medicines used during the test).    FOLLOW UP: Our staff will call the number listed on your records 48-72 hours following your procedure to check on you and address any questions or concerns that you may have regarding the information given to you following your procedure. If we do not reach you, we will leave a message.  We will attempt to reach you two times.  During this call, we will ask if you have developed any symptoms of COVID 19. If you develop any symptoms (ie: fever, flu-like symptoms, shortness of breath, cough etc.) before then, please call [phone].  If you test positive for Covid 19 in the 2 weeks post procedure, please call and report this information to [place].    If any biopsies were taken you will be contacted by phone or by letter within the next 1-3 weeks.  Please call us at [phone] if you have not heard about the biopsies in 3 weeks.    SIGNATURES/CONFIDENTIALITY: You and/or your care partner have signed paperwork which will be entered into your electronic medical record.  These signatures attest to the fact that that the information above on your After Visit Summary has been reviewed and is understood.  Full responsibility of the confidentiality of  this discharge information lies with you and/or your care-partner.

## 2020-03-21 ENCOUNTER — Telehealth: Payer: Self-pay

## 2020-03-21 NOTE — Telephone Encounter (Signed)
°  Follow up Call-  Call back number 03/19/2020  Post procedure Call Back phone  # [phone]  Permission to leave phone message Yes  Some recent data might be hidden     Patient questions:  Do you have a fever, pain , or abdominal swelling? No. Pain Score  0 *  Have you tolerated food without any problems? Yes.    Have you been able to return to your normal activities? Yes.    Do you have any questions about your discharge instructions: Diet   No. Medications  No. Follow up visit  No.  Do you have questions or concerns about your Care? No.  Actions: * If pain score is 4 or above: No action needed, pain <4.  1. Have you developed a fever since your procedure? no  2.   Have you had an respiratory symptoms (SOB or cough) since your procedure? no  3.   Have you tested positive for COVID 19 since your procedure no  4.   Have you had any family members/close contacts diagnosed with the COVID 19 since your procedure?  no   If yes to any of these questions please route to Joylene John, RN and Erenest Rasher, RN

## 2020-04-14 ENCOUNTER — Encounter: Payer: Self-pay | Admitting: Neurology

## 2020-04-14 ENCOUNTER — Other Ambulatory Visit: Payer: Self-pay

## 2020-04-14 ENCOUNTER — Ambulatory Visit (INDEPENDENT_AMBULATORY_CARE_PROVIDER_SITE_OTHER): Payer: Medicare Other | Admitting: Neurology

## 2020-04-14 VITALS — BP 121/59 | HR 58 | Ht 62.0 in | Wt 132.0 lb

## 2020-04-14 DIAGNOSIS — R413 Other amnesia: Secondary | ICD-10-CM

## 2020-04-14 DIAGNOSIS — G44209 Tension-type headache, unspecified, not intractable: Secondary | ICD-10-CM | POA: Diagnosis not present

## 2020-04-14 MED ORDER — NORTRIPTYLINE HCL 10 MG PO CAPS: 10.0000 mg | ORAL_CAPSULE | Freq: Every day | ORAL | 11 refills | Status: DC

## 2020-04-14 NOTE — Progress Notes (Signed)
NEUROLOGY FOLLOW UP OFFICE NOTE  [facility] Andersen [id number] Oct 16, 1950  HISTORY OF PRESENT ILLNESS: I had the pleasure of seeing Shelby Andersen in follow-up in the neurology clinic on 04/14/2020.  The patient was last seen 7 months ago for memory loss and headache. She is alone in the office today. Records and images were personally reviewed where available.  SLUMS score 28/30 in 08/2019. I personally reviewed MRI and MRA brain without contrast done 09/2019 which did not show any acute changes, normal MRA, mild chronic microvascular disease. She reported crying every morning and Sertraline dose was increased to 150mg  daily on last visit. Since her last visit, she continues to report frequent headaches and memory loss. Her memory is "not so good." She denies getting lost driving but sometimes has to think of where she is going and pulls to the side of the road. She loses her phone all the time. She endorses a lot of stress taking care of her husband, she manages both their medications and states "I have no help, everything is my responsibility now." Headaches occur around 4 times a week, she has pain in the frontal and occipital region that appears to be band-like, but also pain shooting down the front of her head from the vertex. She woke up at 1am today with a headache and took over left over Fioricet which helped for a little while. She denies any recent neck pain. No nausea/vomiting. She has periods of insomnia. 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 She aches above both eyes, L>R. She also reports stabbing pain in her left wrist, different from typical arthritis pain. There is a tiny tingling but no numbness. She has had problems with dizziness for at least 40 years, she gets dizzy pretty quickly with minimal movements. She has tinnitus in both ears.  She has been taking 2 Tylenol every morning for arthritis, she cannot take NSAIDs due to history of ulcers.    History on Initial Assessment 09/17/2019: This is a pleasant 69 year old right-handed woman with a history of hyperlipidemia, depression, presenting for evaluation of memory loss and headache. She started noticing memory changes a couple of years ago. She has been concerned that she goes totally blank while carrying on a conversation. She lives with her husband and manages finances without difficulties. She manages both their medications. She denies getting lost driving but has to think of where she is going. Her husband denies any driving concerns. She has not left the stove on. She misplaces her phone frequently. Her son has told her she has repeated the same thing 3 times. Her mother had dementia. No history of significant head injuries or alcohol use. Sleep is not so good, she has difficulty with sleep initiation, in addition she wakes up 3 times to use the bathroom or help her husband when sugar levels go low. She had reduced Trazodone to 25mg  qhs. She states she cries a lot. There have been several deaths in the family since 2016 and her husband has been ill. She cries every morning during her quiet time, missing her mother. Once she gets through it, she is fine the rest of the day. She has been on Sertraline 100mg  daily for the past 2 years. No paranoia or hallucinations. She had been on pantoprazole for GERD which she stopped due to concern for  causing memory loss, no improvement with discontinuation of medication.  She reports a history of 3 migraines in her adult life where she has light sensitivity and extreme nausea. On 07/06/2019, she woke up with excruciating pain different from typical headaches. She had pain on the vertex radiating down and around her head, worse in the occipital region with stiff neck. There was no nausea. She rates the pain as a "15/10" and called her friend to bring  her to the hospital. She could not lay flat. Head CT was normal, morphine got the pain down. She states it has been 2 months but her head does not feel normal. There is a pressure around her head, with tenderness in the occipital regions. Neck is better after visiting her chiropractor. She states it is not pain now, just discomfort. No family history of cerebral aneurysm. She gets dizzy quickly with positional changes, such as laying back on bed. No diplopia, dysarthria/dysphagia, anosmia, tremors. She has osteoarthritis with a lot of pain in her left arm, left hip, and left calf. She has pain in her right hand and left wrist radiating to the thumb. No paresthesias. She reports 2 tick bites with a knot on her left leg, she has been told by an allergist that she was "borderline for chronic Lyme disease." She has low back pain worse in the morning. She has bladder incontinence. For the past 5 weeks, she has had diarrhea with 10-12 BM a day, occasional abdominal pain.    PAST MEDICAL HISTORY: Past Medical History:  Diagnosis Date  . Anemia   . Blood transfusion without reported diagnosis   . Cancer (HCC)    nose basal cell, leg basal cell  . Cataract    minimal  . Depression   . Diverticulosis   . Dysphagia   . Elevated cholesterol   . Fibromyalgia   . GERD (gastroesophageal reflux disease)   . IBS (irritable bowel syndrome)   . Multiple gastric ulcers   . Osteoarthritis   . PONV (postoperative nausea and vomiting)     MEDICATIONS: Current Outpatient Medications on File Prior to Visit  Medication Sig Dispense Refill  . acetaminophen (TYLENOL) 650 MG CR tablet Take 1,300 mg by mouth every 8 (eight) hours as needed for pain.    . Cholecalciferol (VITAMIN D PO) Take 1 tablet by mouth daily.    . Cholecalciferol (VITAMIN D3) 50 MCG (2000 UT) TABS Take by mouth daily. One Tab Daily    . Coenzyme Q10 (COQ10) 200 MG CAPS Take by mouth daily.    Marland Kitchen loperamide (IMODIUM A-D) 2 MG tablet Take 1  tablet (2 mg total) by mouth daily. 30 tablet 0  . Multiple Vitamins-Minerals (MULTIVITAMINS THER. W/MINERALS) TABS tablet Take 1 tablet by mouth daily.    Marland Kitchen oxybutynin (DITROPAN-XL) 5 MG 24 hr tablet Take 5 mg by mouth once. Take 1/2 tablet daily    . pravastatin (PRAVACHOL) 40 MG tablet Take 40 mg by mouth once. Take one tab once daily    . sertraline (ZOLOFT) 100 MG tablet Take 1.5 tablets daily 45 tablet 11  . traZODone (DESYREL) 50 MG tablet Take 50 mg by mouth at bedtime.    . vitamin B-12 (CYANOCOBALAMIN) 1000 MCG tablet Take 3,000 mcg by mouth daily.    Marland Kitchen dicyclomine (BENTYL) 10 MG capsule TAKE 1 CAPSULE BY MOUTH THREE TIMES A DAY BEFORE MEALS (Patient not taking: Reported on 04/14/2020) 270 capsule 5  . NON FORMULARY daily. [employer] Bladder Support (Patient not  taking: Reported on 04/14/2020)     No current facility-administered medications on file prior to visit.    ALLERGIES: Allergies  Allergen Reactions  . Codeine     itching    FAMILY HISTORY: Family History  Problem Relation Age of Onset  . Heart disease Mother   . Heart disease Father   . Skin cancer Brother   . Heart disease Brother   . Colon cancer Maternal Uncle   . Diabetes Maternal Uncle   . Diabetes Maternal Aunt   . Diabetes Maternal Grandmother   . Esophageal cancer Neg Hx   . Stomach cancer Neg Hx   . Rectal cancer Neg Hx     SOCIAL HISTORY: Social History   Socioeconomic History  . Marital status: Married    Spouse name: Not on file  . Number of children: 1  . Years of education: Not on file  . Highest education level: Not on file  Occupational History  . Occupation: retired  Tobacco Use  . Smoking status: Never Smoker  . Smokeless tobacco: Never Used  Vaping Use  . Vaping Use: Never used  Substance and Sexual Activity  . Alcohol use: Yes    Alcohol/week: 0.0 standard drinks    Comment: rare  . Drug use: No  . Sexual activity: Not Currently    Partners: Male  Other Topics Concern  . Not  on file  Social History Narrative   Right handed      Lives with husband in two story home. Stays on first floor      2 years of college edu   Social Determinants of Health   Financial Resource Strain:   . Difficulty of Paying Living Expenses:   Food Insecurity:   . Worried About Charity fundraiser in the Last Year:   . Arboriculturist in the Last Year:   Transportation Needs:   . Film/video editor (Medical):   Marland Kitchen Lack of Transportation (Non-Medical):   Physical Activity:   . Days of Exercise per Week:   . Minutes of Exercise per Session:   Stress:   . Feeling of Stress :   Social Connections:   . Frequency of Communication with Friends and Family:   . Frequency of Social Gatherings with Friends and Family:   . Attends Religious Services:   . Active Member of Clubs or Organizations:   . Attends Archivist Meetings:   Marland Kitchen Marital Status:   Intimate Partner Violence:   . Fear of Current or Ex-Partner:   . Emotionally Abused:   Marland Kitchen Physically Abused:   . Sexually Abused:     PHYSICAL EXAM: Vitals:   04/14/20 1115  BP: (!) 121/59  Pulse: 58  SpO2: 98%   General: No acute distress. Head:  Normocephalic/atraumatic. There is redness and some swelling on the left nasal/infraorbital region Skin/Extremities: No rash, no edema Neurological Exam: alert and oriented to person, place, and time. No aphasia or dysarthria. Fund of knowledge is appropriate.  Recent and remote memory are intact.  Attention and concentration are normal.    Able to name objects and repeat phrases.  MMSE - Mini Mental State Exam 04/14/2020  Orientation to time 5  Orientation to Place 5  Registration 3  Attention/ Calculation 5  Recall 3  Language- name 2 objects 2  Language- repeat 1  Language- follow 3 step command 3  Language- read & follow direction 1  Write a sentence 1  Copy design 1  Total score 30    Cranial nerves: Pupils equal, round, reactive to light.  Extraocular  movements intact with no nystagmus. Visual fields full. No facial asymmetry except for smaller palpebral fissure on left but no ptosis. Motor:moves all extremities symmetrically. Gait narrow-based and steady.   IMPRESSION: This is a pleasant 69 yo RH woman with a history of hyperlipidemia, depression, who presented for memory loss and headaches. MRI/MRA brain no acute changes, there is mild chronic microvascular disease. She continues to report memory changes, we discussed different causes of memory loss, she is under a lot of stress as primary caregiver of her husband. Sertraline dose was increased to 150mg  daily on last visit. Neurocognitive testing will be ordered to further evaluate memory concerns. Headaches possibly tension-type, she is agreeable to start nortriptyline 10mg  qhs for headache prophylaxis, side effects discussed. We may uptitrate as tolerated. Caregiver support provided, she was advised to get more help at home. Follow-up in 6 months, she knows to call for any changes.   Thank you for allowing me to participate in her care.  Please do not hesitate to call for any questions or concerns.   Ellouise Newer, M.D.   CC: Dr. Sallee Provencal

## 2020-04-14 NOTE — Patient Instructions (Addendum)
1. Start nortriptyline 10mg : Take 1 tablet every night. Hold on the Trazodone and see how you feel first if you still need Trazodone for sleep  2. Schedule Neurocognitive testing  3. Follow-up in 6 months, call for any changes

## 2020-05-08 ENCOUNTER — Other Ambulatory Visit: Payer: Self-pay | Admitting: Neurology

## 2020-05-28 ENCOUNTER — Ambulatory Visit (INDEPENDENT_AMBULATORY_CARE_PROVIDER_SITE_OTHER): Payer: Medicare Other | Admitting: Gastroenterology

## 2020-05-28 ENCOUNTER — Encounter: Payer: Self-pay | Admitting: Gastroenterology

## 2020-05-28 VITALS — BP 112/68 | HR 56 | Ht 62.0 in | Wt 129.8 lb

## 2020-05-28 DIAGNOSIS — K52831 Collagenous colitis: Secondary | ICD-10-CM

## 2020-05-28 DIAGNOSIS — K219 Gastro-esophageal reflux disease without esophagitis: Secondary | ICD-10-CM | POA: Diagnosis not present

## 2020-05-28 MED ORDER — PANTOPRAZOLE SODIUM 40 MG PO TBEC: 40.0000 mg | DELAYED_RELEASE_TABLET | ORAL | 3 refills | Status: DC | PRN

## 2020-05-28 NOTE — Patient Instructions (Addendum)
If you are age 70 or older, your body mass index should be between 23-30. Your Body mass index is 23.74 kg/m. If this is out of the aforementioned range listed, please consider follow up with your Primary Care Provider.  If you are age 81 or younger, your body mass index should be between 19-25. Your Body mass index is 23.74 kg/m. If this is out of the aformentioned range listed, please consider follow up with your Primary Care Provider.   Please continue Imodium daily.  Please continue pantoprazole as needed for GERD.  Thank you for entrusting me with your care and choosing [facility].  Dr Ardis Hughs

## 2020-05-28 NOTE — Progress Notes (Signed)
Review of pertinent gastrointestinal problems: 1.  Collagenous colitis.  Chronic loose stools led to colonoscopy June 2021.  Terminal ileum was normal.  Diverticulosis left colon.  Random colon biopsies from normal-appearing colon mucosa improved "collagenous colitis"   Long-term patient at [facility] practice before establishing with [facility] gastroenterology 2021.  She was last seen 2 and half years ago there.  The time she was having a lot of diarrhea.  The documented that she had had a screening colonoscopy in 2010 in [place] and it was normal.  They also documented that she underwent an EGD August 2017 for dysphagia.  Esophagus looked pretty normal, biopsies were negative for EOE.  Given her acute diarrhea GI pathogen panel was sent and she was started on Flagyl to begin after the specimen was dropped off.  Her stool testing was negative and so she was put on dicyclomine 10 mg to 3 times daily.  Sounds like dicyclomine helped   HPI: This is a very pleasant 69 year old woman who is here with a friend of hers today.  Diagnosed with collagenous colitis 2 or 3 months ago.  Since then she has been taking a single Imodium every single morning shortly after waking and has noticed significant improvement in her bowels.  Previously she was moving her bowels 10-15 times daily.  Since she started the Imodium she goes 3 or 4 times a day without any urgency.  On a single occasion she took a second Imodium and that helped even better.  She has had no bleeding.  She does have minor intermittent GERD, indigestion.  Pantoprazole helps.  Pepcid did not help.   ROS: complete GI ROS as described in HPI, all other review negative.  Constitutional:  No unintentional weight loss   Past Medical History:  Diagnosis Date  . Anemia   . Blood transfusion without reported diagnosis   . Cancer (HCC)    nose basal cell, leg basal cell  . Cataract    minimal  . Colitis 03/19/2020  . Depression   .  Diverticulosis   . Dysphagia   . Elevated cholesterol   . Fibromyalgia   . GERD (gastroesophageal reflux disease)   . IBS (irritable bowel syndrome)   . Multiple gastric ulcers   . Osteoarthritis   . PONV (postoperative nausea and vomiting)     Past Surgical History:  Procedure Laterality Date  . COLONOSCOPY  2010   [place]  . DILATION AND CURETTAGE OF UTERUS    . ESOPHAGEAL DILATION N/A 04/21/2016   Procedure: ESOPHAGEAL DILATION;  Surgeon: Rogene Houston, MD;  [facility];  Service: Endoscopy;  Laterality: N/A;  . ESOPHAGOGASTRODUODENOSCOPY N/A 04/21/2016   Procedure: ESOPHAGOGASTRODUODENOSCOPY (EGD);  Surgeon: Rogene Houston, MD;  Location: [facility];  Service: Endoscopy;  Laterality: N/A;  2:25  . FOOT SURGERY      Current Outpatient Medications  Medication Sig Dispense Refill  . acetaminophen (TYLENOL) 650 MG CR tablet Take 1,300 mg by mouth every 8 (eight) hours as needed for pain.    . Cholecalciferol (VITAMIN D PO) Take 1 tablet by mouth daily.    . Cholecalciferol (VITAMIN D3) 50 MCG (2000 UT) TABS Take by mouth daily. One Tab Daily    . Coenzyme Q10 (COQ10) 200 MG CAPS Take by mouth daily.    Marland Kitchen loperamide (IMODIUM A-D) 2 MG tablet Take 1 tablet (2 mg total) by mouth daily. 30 tablet 0  . Melatonin 10 MG TABS Take by mouth at  bedtime.    . Multiple Vitamins-Minerals (MULTIVITAMINS THER. W/MINERALS) TABS tablet Take 1 tablet by mouth daily.    . NON FORMULARY Magnilife Leg and Back pain relief PRN as needed    . oxybutynin (DITROPAN-XL) 5 MG 24 hr tablet Take 5 mg by mouth once. Take 1/2 tablet daily    . pravastatin (PRAVACHOL) 40 MG tablet Take 40 mg by mouth once. Take one tab once daily    . sertraline (ZOLOFT) 100 MG tablet Take 1.5 tablets daily 45 tablet 11  . traZODone (DESYREL) 50 MG tablet Take 50 mg by mouth at bedtime.    . vitamin B-12 (CYANOCOBALAMIN) 1000 MCG tablet Take 3,000 mcg by mouth daily.     No current facility-administered  medications for this visit.    Allergies as of 05/28/2020 - Review Complete 05/28/2020  Allergen Reaction Noted  . Codeine  02/27/2016    Family History  Problem Relation Age of Onset  . Heart disease Mother   . Heart disease Father   . Skin cancer Brother   . Heart disease Brother   . Colon cancer Maternal Uncle   . Diabetes Maternal Uncle   . Diabetes Maternal Aunt   . Diabetes Maternal Grandmother   . Esophageal cancer Neg Hx   . Stomach cancer Neg Hx   . Rectal cancer Neg Hx     Social History   Socioeconomic History  . Marital status: Married    Spouse name: Not on file  . Number of children: 1  . Years of education: Not on file  . Highest education level: Not on file  Occupational History  . Occupation: retired  Tobacco Use  . Smoking status: Never Smoker  . Smokeless tobacco: Never Used  Vaping Use  . Vaping Use: Never used  Substance and Sexual Activity  . Alcohol use: Yes    Alcohol/week: 0.0 standard drinks    Comment: rare  . Drug use: No  . Sexual activity: Not Currently    Partners: Male  Other Topics Concern  . Not on file  Social History Narrative   Right handed      Lives with husband in two story home. Stays on first floor      2 years of college edu      Drinks Caffeine. Coffee and Tea   Social Determinants of Health   Financial Resource Strain:   . Difficulty of Paying Living Expenses: Not on file  Food Insecurity:   . Worried About Charity fundraiser in the Last Year: Not on file  . Ran Out of Food in the Last Year: Not on file  Transportation Needs:   . Lack of Transportation (Medical): Not on file  . Lack of Transportation (Non-Medical): Not on file  Physical Activity:   . Days of Exercise per Week: Not on file  . Minutes of Exercise per Session: Not on file  Stress:   . Feeling of Stress : Not on file  Social Connections:   . Frequency of Communication with Friends and Family: Not on file  . Frequency of Social  Gatherings with Friends and Family: Not on file  . Attends Religious Services: Not on file  . Active Member of Clubs or Organizations: Not on file  . Attends Archivist Meetings: Not on file  . Marital Status: Not on file  Intimate Partner Violence:   . Fear of Current or Ex-Partner: Not on file  . Emotionally Abused: Not on file  .  Physically Abused: Not on file  . Sexually Abused: Not on file     Physical Exam: BP 112/68   Pulse (!) 56   Ht 5\' 2"  (1.575 m)   Wt 129 lb 12.8 oz (58.9 kg)   BMI 23.74 kg/m  Constitutional: generally well-appearing Psychiatric: alert and oriented x3 Abdomen: soft, nontender, nondistended, no obvious ascites, no peritoneal signs, normal bowel sounds No peripheral edema noted in lower extremities  Assessment and plan: 69 y.o. female with collagenous colitis  We had a nice discussion about collagenous colitis.  Since Imodium 1 pill once daily seems to have helped so well I did not recommend any other medical treatment options at this point.  She will continue on it she knows she can take a second Imodium later in the day if needed.  She knows it the start of constipated than she should back off.  On the other hand if the Imodium is not helping in the future she should call for further advice.  She has minor GERD symptoms.  No alarm symptoms.  She will continue taking pantoprazole on an as-needed basis.  Please see the "Patient Instructions" section for addition details about the plan.  Owens Loffler, MD [facility] 05/28/2020, 8:44 AM   Total time on date of encounter was 30 minutes (this included time spent preparing to see the patient reviewing records; obtaining and/or reviewing separately obtained history; performing a medically appropriate exam and/or evaluation; counseling and educating the patient and family if present; ordering medications, tests or procedures if applicable; and documenting clinical information in the  health record).

## 2020-05-29 ENCOUNTER — Other Ambulatory Visit: Payer: Self-pay

## 2020-06-02 ENCOUNTER — Encounter: Payer: Self-pay | Admitting: Counselor

## 2020-06-02 ENCOUNTER — Ambulatory Visit (INDEPENDENT_AMBULATORY_CARE_PROVIDER_SITE_OTHER): Payer: Medicare Other | Admitting: Counselor

## 2020-06-02 ENCOUNTER — Ambulatory Visit: Payer: Medicare Other | Admitting: Psychology

## 2020-06-02 ENCOUNTER — Other Ambulatory Visit: Payer: Self-pay

## 2020-06-02 DIAGNOSIS — G3184 Mild cognitive impairment, so stated: Secondary | ICD-10-CM

## 2020-06-02 DIAGNOSIS — Z730 Burn-out: Secondary | ICD-10-CM

## 2020-06-02 DIAGNOSIS — F329 Major depressive disorder, single episode, unspecified: Secondary | ICD-10-CM

## 2020-06-02 DIAGNOSIS — R4189 Other symptoms and signs involving cognitive functions and awareness: Secondary | ICD-10-CM

## 2020-06-02 DIAGNOSIS — F32A Depression, unspecified: Secondary | ICD-10-CM

## 2020-06-02 NOTE — Progress Notes (Signed)
   Psychometrist Note   Cognitive testing was administered to Shelby Andersen by Milana Kidney, B.S. (Technician) under the supervision of Alphonzo Severance, Psy.D., ABN. Shelby Andersen was able to tolerate all test procedures. Dr. Nicole Kindred met with the patient as needed to manage any emotional reactions to the testing procedures. Rest breaks were offered.    The battery of tests administered was selected by Dr. Nicole Kindred with consideration to the patient's current level of functioning, the nature of her symptoms, emotional and behavioral responses during the interview, level of literacy, observed level of motivation/effort, and the nature of the referral question. This battery was communicated to the psychometrist. Communication between Dr. Nicole Kindred and the psychometrist was ongoing throughout the evaluation and Dr. Nicole Kindred was immediately accessible at all times. Dr. Nicole Kindred provided supervision to the technician on the date of this service, to the extent necessary to assure the quality of all services provided.    Shelby Andersen will return in approximately one week for an interactive feedback session with Dr. Nicole Kindred, at which time test performance, clinical impressions, and treatment recommendations will be reviewed in detail. The patient understands she can contact our office should she require our assistance before this time.   A total of 95 minutes of billable time were spent with Shelby Andersen by the technician, including test administration and scoring time. Billing for these services is reflected in Dr. Les Pou note.   This note reflects time spent with the psychometrician and does not include test scores, clinical history, or any interpretations made by Dr. Nicole Kindred. The full report will follow in a separate note.

## 2020-06-02 NOTE — Progress Notes (Signed)
[facility]  Patient Name: Raiden Yearwood MRN: [id number] Date of Birth: 1950/11/04 Age: 69 y.o. Education: 14 years  Referral Circumstances and Background Information  Ms. Stanislawa Gaffin is a 69 y.o., right-hand dominant, married woman with a history of HLD, depression/caregiver burnout, and headaches. She is following with Dr. Delice Lesch for her headaches and was referred to neuropsychology for evaluation of memory loss.   On interview, Ms. Lukins reported noticing cognitive problems over approximately the past two years she thinks. She perceives her problems to be worsening over time. She will be in the middle of a conversation and will lose her train of thought, she loses her phone "constantly," and she often loses track of what she is doing in the middle of a task or why she entered a room. She thinks that difficulties with attention are primary to her problems, and feels as though her mind "jumps" from one thing to the next. She is not getting lost while driving but she does have to think harder about where she is going. On detailed review of symptoms, her family members have noticed her changes, her son has commented that she is repeating herself "over and over and over again." She has problems with word finding. She does not have problems with orientation although she is highly dependent on her calendar. She was caring for three other family members so she had a lot of things on her plate. The other family members she was caring for have passed, but she is the primary caretaker for her husband who had lung cancer, several strokes, and gastroparesis. She is also having significant difficulties with headaches and head pain, which are detailed extensively in Dr. Amparo Bristol notes. With respect to mood, the patient reported that she "deals with depression." She has a strong faith and depends on that to get her through. She has a lot of stress and does get anxious.  Nevertheless, she sleeps fairly well, although she does take trazodone. She usually gets about 8 hours of sleep a night. Her energy is low and she feels tired frequently. She also fatigues easily when going up stairs. She has lost about 10lbs in the past 3 months, her appetite is somewhat less than it was previously.   With respect to functioning, the patient is performing relatively well despite her perceived cognitive problems. She is the primary caretaker for her husband and is responsible for all the finances, things around the house, with cooking and the like. She reported that most of these things have been going well, although she did struggle to learn how to do the online banking. Her son has tried to teach her and she has also gotten help from the bank but she has had a hard time picking it up. She is not Pharmacologist but has put a fair amount of effort into it. She has no issues using her smart phone. She is able to cook and is also driving fairly well. She is managing her own medications.    Past Medical History and Review of Relevant Studies   Patient Active Problem List   Diagnosis Date Noted  . Depression 01/14/2020  . Hyperlipidemia 01/14/2020  . Peptic ulcer disease 08/19/2019  . Irritable bowel syndrome 08/19/2019  . Fibromyalgia, primary 04/14/2016  . Dysphagia 02/27/2016  . Osteoarthritis 02/27/2016    Review of Neuroimaging and Relevant Medical History: The patient has an MRI brain from 09/29/2019 that shows mild volume loss. There is a bit more  atrophy in the mesial temporal lobes and perhaps also in the posterior portions of the brain (as suggested by the size of the lateral ventricles), although the extent of volume loss is not clearly pathological. There is a minimal burden of mainly periventricular leukoaraiosis.   Current Outpatient Medications  Medication Sig Dispense Refill  . acetaminophen (TYLENOL) 650 MG CR tablet Take 1,300 mg by mouth every 8 (eight) hours as  needed for pain.    . Cholecalciferol (VITAMIN D PO) Take 1 tablet by mouth daily.    . Cholecalciferol (VITAMIN D3) 50 MCG (2000 UT) TABS Take by mouth daily. One Tab Daily    . Coenzyme Q10 (COQ10) 200 MG CAPS Take by mouth daily.    Marland Kitchen loperamide (IMODIUM A-D) 2 MG tablet Take 1 tablet (2 mg total) by mouth daily. 30 tablet 0  . Melatonin 10 MG TABS Take by mouth at bedtime.    . Multiple Vitamins-Minerals (MULTIVITAMINS THER. W/MINERALS) TABS tablet Take 1 tablet by mouth daily.    . NON FORMULARY Magnilife Leg and Back pain relief PRN as needed    . oxybutynin (DITROPAN-XL) 5 MG 24 hr tablet Take 5 mg by mouth once. Take 1/2 tablet daily    . pantoprazole (PROTONIX) 40 MG tablet Take 1 tablet (40 mg total) by mouth as needed. 90 tablet 3  . pravastatin (PRAVACHOL) 40 MG tablet Take 40 mg by mouth once. Take one tab once daily    . sertraline (ZOLOFT) 100 MG tablet Take 1.5 tablets daily 45 tablet 11  . traZODone (DESYREL) 50 MG tablet Take 50 mg by mouth at bedtime.    . vitamin B-12 (CYANOCOBALAMIN) 1000 MCG tablet Take 3,000 mcg by mouth daily.     No current facility-administered medications for this visit.    Family History  Problem Relation Age of Onset  . Heart disease Mother   . Heart disease Father   . Skin cancer Brother   . Heart disease Brother   . Colon cancer Maternal Uncle   . Diabetes Maternal Uncle   . Diabetes Maternal Aunt   . Diabetes Maternal Grandmother   . Esophageal cancer Neg Hx   . Stomach cancer Neg Hx   . Rectal cancer Neg Hx    There is a family history of dementia. She reported that her mother likely had Alzheimer's disease and "couldn't remember anything." She became symptomatic in her mid 17s.There is no  family history of psychiatric illness.  Psychosocial History  Developmental, Educational and Employment History: The patient is a native of [place], she denied any frank history of abuse but described her father as an alcoholic. The patient  reported that she was an adequate student, although she always had a hard time at math. She nevertheless was never held back and didn't have any learning issues. 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 She retired when she was around 67.   Psychiatric History: The patient is currently taking Zoloft, she was on citalopram and felt as though it wasn't working. Her medications are prescribed by her PCP. She has seen a psychiatrist previously, Dr. Ernst Bowler in [place], when she was having problems functioning at work (couldn't recall how to [place] a lock) that were thought to be due to depression.  She has never been in counseling although it has been recommended by her PCP.   Substance Use History: The patient doesn't drink, use drugs, or smoke.   Relationship History and Living Cimcumstances: The patient and her husband have been married for 46 years. They have one son, he lives close to them and they are frequently in touch and have a good relationship.   Mental Status and Behavioral Observations  Sensorium/Arousal: The patient's level of arousal was awake and alert. Hearing and vision were adequate with correction (glasses) for testing purposes. Orientation: The patient was fully oriented to person, place, time, and situation.  Appearance: Dressed neatly in casual clothing with good grooming and hygiene.  Behavior: The patient was pleasant, appropriate, and seemed to be forthcoming with her history giving Speech/language: No word finding pauses or paraphasic errors were noted. Her speech was normal in rate, rhythm, and volume.  Gait/Posture: Not formally examined, normal on exam with Dr. Delice Lesch.  Movement: The patient had no overt signs/symptoms of movement  disorder such as masked facies, tremor, or bradykinesia Social Comportment: Pleasant and appropriate Mood: "I deal with depression" Affect: Mainly neutral Thought process/content: The patient was logical and goal-oriented in her thought process. She had no difficulties assembling and relating a detailed personal history and timeline. Thought content was appropriate and pertinent.  Safety: No safety concerns identified at this visit.  Insight: Fair, patient is appropriately concerned about perceived changes  Montreal Cognitive Assessment  06/03/2020  Visuospatial/ Executive (0/5) 3  Naming (0/3) 3  Attention: Read list of digits (0/2) 2  Attention: Read list of letters (0/1) 1  Attention: Serial 7 subtraction starting at 100 (0/3) 3  Language: Repeat phrase (0/2) 2  Language : Fluency (0/1) 1  Abstraction (0/2) 2  Delayed Recall (0/5) 5  Orientation (0/6) 6  Total 28   Test Procedures  Wide Range Achievement Test - 4             Word Reading Neuropsychological Assessment Battery  List Learning  Story Learning  Daily Living Memory  Naming  Digit Span Repeatable Battery for the Assessment of Neuropsychological Status (Form A)  Figure Copy  Judgment of Line Orientation  Coding  Figure Recall The Dot Counting Test A Random Letter Test Controlled Oral Word Association (F-A-S) Semantic Fluency (Animals) Trail Making Test A & B Complex Ideational Material Modified [place] Card Sorting Test Geriatric Depression Scale - Short Form  Plan  Rosalea Withrow was seen for a psychiatric diagnostic evaluation and neuropsychological testing. She is a pleasant, 69 year old, right-hand dominant married woman with a significant amount of loss as of late, having lost her parents and in laws over the past several years. Unfortunately, her husband also has significant health issues with lung cancer, multiple strokes, and diabetes. She is noticing day-to-day forgetfulness and problems with  attention/concentration but continues to function well. Her cognitive screening is in the normal range, but further neuropsychological testing will be helpful to make sure nothing is being missed. Full and complete note with impressions, recommendations, and interpretation of test data to follow.   Viviano Simas Nicole Kindred, PsyD, [facility]  Informed Consent and Coding/Compliance  Risks and benefits of the evaluation were discussed with the patient prior to all testing procedures. I conducted a clinical interview and neuropsychological testing (at least two tests) with Silvana Newness and Milana Kidney, B.S. (Technician) administered additional test procedures. The patient was able to tolerate the testing procedures and the patient (and/or family if applicable) is  likely to benefit from further follow up to receive the diagnosis and treatment recommendations, which will be rendered at the next encounter. Billing below reflects technician time, my direct face-to-face time with the patient, time spent in test administration, and time spent in professional activities including but not limited to: neuropsychological test interpretation, integration of neuropsychological test data with clinical history, report preparation, treatment planning, care coordination, and review of diagnostically pertinent medical history or studies.   Services associated with this encounter: Clinical Interview [id number]) plus 60 minutes (67124; Neuropsychological Evaluation by Professional)  120 minutes (58099; Neuropsychological Evaluation by Professional, Adl.) 18 minutes (83382; Test Administration by Professional) 30 minutes (50539; Neuropsychological Testing by Technician) 65 minutes (76734; Neuropsychological Testing by Technician, Adl.)

## 2020-06-03 ENCOUNTER — Encounter: Payer: Self-pay | Admitting: Counselor

## 2020-06-04 NOTE — Progress Notes (Signed)
[facility]  Patient Name: Warrene Kapfer MRN: [id number] Date of Birth: 07-13-1951 Age: 69 y.o. Education: 14 years  Measurement properties of test scores: IQ, Index, and Standard Scores (SS): Mean = 100; Standard Deviation = 15 Scaled Scores (Ss): Mean = 10; Standard Deviation = 3 Z scores (Z): Mean = 0; Standard Deviation = 1 T scores (T); Mean = 50; Standard Deviation = 10  TEST SCORES:    Note: This summary of test scores accompanies the interpretive report and should not be interpreted by unqualified individuals or in isolation without reference to the report. Test scores are relative to age, gender, and educational history as available and appropriate.   Performance Validity        "A" Random Letter Test Raw  Descriptor      Errors 0 Within Expectation  The Dot Counting Test: 8 Within Expectation      Mental Status Screening     Total Score Descriptor  MoCA 28 Normal      Expected Functioning        Wide Range Achievement Test: Standard/Scaled Score Percentile      Word Reading 109 73      Attention/Processing Speed        Neuropsychological Assessment Battery (Attention Module, Form 1): T-score Percentile      Digits Forward 57 75      Digits Backwards 58 79      Repeatable Battery for the Assessment of Neuropsychological Status (Form A): Scaled Score Percentile      Coding 11 63      Language        Neuropsychological Assessment Battery (Language Module, Form 1): T-score Percentile      Naming   (30) 58 79      Verbal Fluency: T-score Percentile      Controlled Oral Word Association (F-A-S) 52 58      Semantic Fluency (Animals) 35 7      Memory:        Neuropsychological Assessment Battery (Memory Module, Form 1): T-score Percentile      List Learning           List A Immediate Recall   (3, 7, 9) 43 25         List B Immediate Recall   (2) 36 8         List A Short Delayed Recall   (6) 45 31         List A  Long Delayed Recall   (7) 50 50         List A Percent Retention   (117 %) --- 75         List A Long Delayed Yes/No Recognition Hits   (12) --- 88         List A Long Delayed Yes/No Recognition False Alarms   (8) --- 14         List A Recognition Discriminability Index --- 21     Story Learning           Immediate Recall   (27, 37) 52 58         Delayed Recall   (36) 58 79         Percent Retention   (97 %) --- 58      Daily Living Memory            Immediate Recall   (24, 23) 62 88  Delayed Recall   (9, 7) 62 88          Percent Retention (100 %) --- 84          Recognition Hits    (10) --- 86      Repeatable Battery for the Assessment of Neuropsychological Status (Form A): Scaled Score Percentile         Figure Recall   (12) 10 50      Visuospatial/Constructional Functioning        Repeatable Battery for the Assessment of Neuropsychological Status (Form A): Standard/Scaled Score Percentile     Visuospatial/Constructional Index 116 86         Figure Copy   (19) 12 75         Judgment of Line Orientation   (19) --- >75      Executive Functioning        Modified [place] Card Sorting Test (MWCST): Standard/T-Score Percentile      Number of Categories Correct 59 82      Number of Perseverative Errors 67 96      Number of Total Errors 50 50      Percent Perseverative Errors 65 93  Executive Function Composite 121 92      Trail Making Test: T-Score Percentile      Part A 53 62      Part B 51 54      [place] Diagnostic Aphasia Exam: Raw Score Scaled Score      Complex Ideational Material 12 12      Clock Drawing Raw Score Descriptor      Command 10 WNL      Rating Scales        Clinical Dementia Rating Raw Score Descriptor      Sum of Boxes 1.0 MCI      Global Score 0.5 MCI       Raw Score Descriptor  Geriatric Depression Scale - Short Form 5 Negative   Jaylani Mcguinn V. Nicole Kindred PsyD, [place] Clinical Neuropsychologist

## 2020-06-09 ENCOUNTER — Encounter: Payer: Self-pay | Admitting: Counselor

## 2020-06-09 ENCOUNTER — Other Ambulatory Visit: Payer: Self-pay

## 2020-06-09 ENCOUNTER — Ambulatory Visit (INDEPENDENT_AMBULATORY_CARE_PROVIDER_SITE_OTHER): Payer: Medicare Other | Admitting: Counselor

## 2020-06-09 DIAGNOSIS — F4323 Adjustment disorder with mixed anxiety and depressed mood: Secondary | ICD-10-CM | POA: Diagnosis not present

## 2020-06-09 NOTE — Patient Instructions (Signed)
Your performance and presentation on assessment were consistent with fairly good performance in all areas. You certainly do not have a clinically evident dementia syndrome. Factors to consider for some contribution include current stress level, depressive issues, and pain related to headaches (when symptomatic).   I think that your day-to-day cognitive problems are likely due to the reversible causes mentioned above, and therefore, they are likely treatable. The following suggestions are recommended:  Caregiver burn out refers to increased stress, mood problems, and other difficulties that are seen frequently in individuals who care for others. Caregiver burnout can lead to depression. It is also self-defeating, because it can interfere with a caregiver's ability to care for others.There is research that mortality in spouses who provide care to a demented loved one may be higher than for the individual with dementia themselves. For all these reasons, I recommend that you to actively manage your stress level and take as good care of yourself as you do of your loved one. Strategies that may help include:   Splitting caregiving responsibilities amongst multiple family members.  Getting paid supports so that you can take some time to yourself.  Aggressively treating depression and anxiety that are understandable reactions to the stress of caregiving. This may include counseling or antidepressant medication depending on your symptoms and treatment of choice.   Even something simple such as scheduling small periods of time every day where you can engage in a desired hobby or pleasurable activity can be extremely helpful.   You have a significant amount of stress, and I recommend that you engage in stress management strategies. Self-directed activities such as positive activity planning, mindfulness, and the like may be helpful. We also discussed therapeutic treatment, which can help you to develop new  coping skills, and different more adaptive ways of dealing with the stress that comes with caregiving for a loved one. You were not interested in a referral, I would suggest that you reach out to your primary care office in the future if you decide you would like to proceed.   There is now good quality evidence from at least one large scale study that a modified mediterranean diet may help slow cognitive decline. This is known as the "MIND" diet. The Mind diet is not so much a specific diet as it is a set of recommendations for things that you should and should not eat.   Foods that are ENCOURAGED on the MIND Diet:  Green, leafy vegetables: Aim for six or more servings per week. This includes kale, spinach, cooked greens and salads.  All other vegetables: Try to eat another vegetable in addition to the green leafy vegetables at least once a day. It is best to choose non-starchy vegetables because they have a lot of nutrients with a low number of calories.  Berries: Eat berries at least twice a week. There is a plethora of research on strawberries, and other berries such as blueberries, raspberries and blackberries have also been found to have antioxidant and brain health benefits.  Nuts: Try to get five servings of nuts or more each week. The creators of the [place] don't specify what kind of nuts to consume, but it is probably best to vary the type of nuts you eat to obtain a variety of nutrients. Peanuts are a legume and do not fall into this category.  Olive oil: Use olive oil as your main cooking oil. There may be other heart-healthy alternatives such as algae oil, though there is not  yet sufficient research upon which to base a formal recommendation.  Whole grains: Aim for at least three servings daily. Choose minimally processed grains like oatmeal, quinoa, brown rice, whole-wheat pasta and 100% whole-wheat bread.  Fish: Eat fish at least once a week. It is best to choose fatty fish like salmon,  sardines, trout, tuna and mackerel for their high amounts of omega-3 fatty acids.  Beans: Include beans in at least four meals every week. This includes all beans, lentils and soybeans.  Poultry: Try to eat chicken or [place] at least twice a week. Note that fried chicken is not encouraged on the MIND diet.  Wine: Aim for no more than one glass of alcohol daily. Both red and white wine may benefit the brain. However, much research has focused on the red wine compound resveratrol, which may help protect against Alzheimer's disease.  Foods that are DISCOURAGED on the MIND Diet: Butter and margarine: Try to eat less than 1 tablespoon (about 14 grams) daily. Instead, try using olive oil as your primary cooking fat, and dipping your bread in olive oil with herbs.  Cheese: The MIND diet recommends limiting your cheese consumption to less than once per week.  Red meat: Aim for no more than three servings each week. This includes all beef, pork, lamb and products made from these meats.  [place] Pro food: The MIND diet highly discourages fried food, especially the kind from fast-food restaurants. Limit your consumption to less than once per week.  Pastries and sweets: This includes most of the processed junk food and desserts you can think of. Ice cream, cookies, brownies, snack cakes, donuts, candy and more. Try to limit these to no more than four times a week.  Exercise is one of the best medicines for promoting health and maintaining cognitive fitness at all stages in life. Exercise probably has the largest documented effect on brain health and performance of any lifestyle intervention. Studies have shown that even previously sedentary individuals who start exercising as late as age 67 show a significant survival benefit as compared to their non-exercising peers. In the [place], the current guidelines are for 30 minutes of moderate exercise per day, but increasing your activity level less than that may also be  helpful. You do not have to get your 30 minutes of exercise in one shot and exercising for short periods of time spread throughout the day can be helpful. Go for several walks, learn to dance, or do something else you enjoy that gets your body moving. Of course, if you have an underlying medical condition or there is any question about whether it is safe for you to exercise, you should consult a medical treatment provider prior to beginning exercise.   Some cognitive abilities (such as processing speed) naturally decline with age, but there are many things you can do to contribute to healthy cognitive aging. There is evidence from at least one study that a modified low carbohydrate mediterranean diet (the MIND-DASH) diet can contribute to healthy cognitive aging. There is also some evidence to suggest a beneficial effect of coffee (black coffee without sugar or other additives such as cream) for healthy aging. One glass of red wine a day may also contribute to healthy aging though at two drinks you lose all benefit and at three drinks it may be doing more harm than good. Staying active, mentally and physically, are also crucially important. One of the best ways to do this is simply to stay active and engaged  in your life, particularly with social activities. Challenging the mind and other cognitively stimulating activities are encouraged, consider learning a new hobby, reconnecting with old friends, reading an interesting and thought provoking book. It is not so much what you do that is important as it is that you enjoy it and stay at it.

## 2020-06-09 NOTE — Progress Notes (Signed)
[facility]  Telemedicine statement:  I discussed the limitations of neuropsychological care via telemedicine and the availability of in person appointments. The patient expressed understanding and agreed to proceed. The patient was verified with two identifiers.  The visit modality was: telephonic The patient location was: home The provider location was: office  The following individuals participated: Shelby Andersen  Feedback Note: I met with Shelby Andersen to review the findings resulting from her neuropsychological evaluation. Since the last appointment, she has been about the same. She continues with a fair amount of stress, she had to take her dog for chemotherapy. Her symptoms are largely unchanged. Time was spent reviewing the impressions and recommendations that are detailed in the evaluation report. We discussed impression of normal neuropsychological performance, as reflected in the patient instructions.I counseled her regarding potential anticholinergic side effects of oxybutinin. We discussed a referral for psychotherapy and she wanted to hold off at this time, although she will think about it. She was interested in brain-health recommendations, which I will send to her. Also set her up with mychart. I took time to explain the findings and answer all the patient's questions. I encouraged Shelby Andersen to contact me should she have any further questions or if further follow up is desired.   Current Medications and Medical History   Current Outpatient Medications  Medication Sig Dispense Refill  . acetaminophen (TYLENOL) 650 MG CR tablet Take 1,300 mg by mouth every 8 (eight) hours as needed for pain.    . Cholecalciferol (VITAMIN D PO) Take 1 tablet by mouth daily.    . Cholecalciferol (VITAMIN D3) 50 MCG (2000 UT) TABS Take by mouth daily. One Tab Daily    . Coenzyme Q10 (COQ10) 200 MG CAPS Take by mouth daily.    Marland Kitchen loperamide (IMODIUM A-D) 2  MG tablet Take 1 tablet (2 mg total) by mouth daily. 30 tablet 0  . Melatonin 10 MG TABS Take by mouth at bedtime.    . Multiple Vitamins-Minerals (MULTIVITAMINS THER. W/MINERALS) TABS tablet Take 1 tablet by mouth daily.    . NON FORMULARY Magnilife Leg and Back pain relief PRN as needed    . oxybutynin (DITROPAN-XL) 5 MG 24 hr tablet Take 5 mg by mouth once. Take 1/2 tablet daily    . pantoprazole (PROTONIX) 40 MG tablet Take 1 tablet (40 mg total) by mouth as needed. 90 tablet 3  . pravastatin (PRAVACHOL) 40 MG tablet Take 40 mg by mouth once. Take one tab once daily    . sertraline (ZOLOFT) 100 MG tablet Take 1.5 tablets daily 45 tablet 11  . traZODone (DESYREL) 50 MG tablet Take 50 mg by mouth at bedtime.    . vitamin B-12 (CYANOCOBALAMIN) 1000 MCG tablet Take 3,000 mcg by mouth daily.     No current facility-administered medications for this visit.    Patient Active Problem List   Diagnosis Date Noted  . Depression 01/14/2020  . Hyperlipidemia 01/14/2020  . Peptic ulcer disease 08/19/2019  . Irritable bowel syndrome 08/19/2019  . Fibromyalgia, primary 04/14/2016  . Dysphagia 02/27/2016  . Osteoarthritis 02/27/2016    Mental Status and Behavioral Observations  Shelby Andersen was available at the prespecified time for this telephonic appointment and was alert and generally oriented (orientation not formally assessed). Speech was normal in rate, rhythm, volume, and prosody. Self-reported mood was "allright" and affect as assessed by vocal quality was neutral. Thought process was logical and goal-oriented and thought content was appropriate to  the topics discussed. There were no safety concerns identified at today's encounter, such as thoughts of harming self or others.   Plan  Feedback provided regarding the patient's neuropsychological evaluation. She has significant caregiver strain related to her caregiving obligations, which has also been noticed by her son, and he has suggested  she get help. I suggested the same and will send her patient instructions from today's visit with MIND diet and other brain health recommendations. Shelby Andersen was encouraged to contact me if any questions arise or if further follow up is desired.   Shelby Andersen Shelby Kindred, PsyD, ABN Clinical Neuropsychologist  Service(s) Provided at This Encounter: 28 minutes [phone]; Psychotherapy with patient/family)

## 2020-06-09 NOTE — Progress Notes (Signed)
[facility]  Patient Name: Shelby Andersen MRN: [id number] Date of Birth: [birth year] Age: 69 y.o. Education: 53 years  Clinical Impressions  Shelby Andersen is a 69 y.o., right-hand dominant, married woman with a history of depression/caregiver burnout, HLD, and headaches who was referred by Dr. Delice Lesch for characterization of cognitive status. She has been having memory problems over approximately the past year but is also under a great deal of stress being the primary caretaker for her husband who has a number of ongoing health issues. She feels like her mind "jumps" anxiously from one thing to the next and is noticing some difficulties with attention, maintaining her train of thought and the like. She is on Zoloft 150mg  QD and has a strong faith, which helps her cope.   This is a normal neuropsychological study. Specifically, there are no patterns of low scores concerning for impaired cognitive abilities. She did have a couple of marginal findings on semantic fluency and some susceptibility to proactive interference on list learning, although these are isolated low scores amongst otherwise good test performance. She did very well on a challenging measure of executive functioning involving card sorting, achieving a superior score on the Executive Function Composite. She screened negative for the presence of clinically significant depression although as above, she has much stress related to caregiving.   Impression is that of good cognitive abilities as compared to her age and education cohort. She may have interference from her stress level, numerous obligations, and grief in the setting of her husband's illness and the passing of multiple family members over the past few years. It is possible that there are also some subtle medication side effects (on Ditropan XL) although as above, they are not neuropsychologically detectable at this point. She does not have any  findings concerning for a clinically manifest dementia or mild cognitive impairment syndrome. Would recommend that she consider psychotherapy. She may consider preventative strategies if she is worried about cognitive decline.   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 You certainly do not have a clinically evident dementia syndrome. Factors to consider for some contribution include current stress level, depressive issues, and pain related to headaches (when symptomatic).   I think that your day-to-day cognitive problems are likely due to the reversible causes mentioned above, and therefore, they are likely treatable. The following suggestions are recommended:  Caregiver burn out refers to increased stress, mood problems, and other difficulties that are seen frequently in individuals who care for others. Caregiver burnout can lead to depression. It is also self-defeating, because it can interfere with a caregiver's ability to care for others.There is research that mortality in spouses who provide care to a demented loved one may be higher than for the individual with dementia themselves. For all these reasons, I recommend that you to actively manage your stress level and take as good care of yourself as you do of your loved one. Strategies that may help include:  . Splitting caregiving responsibilities amongst multiple family members. . Getting paid supports so that you can take some time to yourself. . Aggressively treating depression and anxiety that are understandable reactions to the stress of caregiving. This may include counseling or antidepressant medication depending on your symptoms and treatment of choice.  . Even something simple such as scheduling small periods of time  every day  where you can engage in a desired hobby or pleasurable activity can be extremely helpful.   You have a significant amount of stress, and I recommend that you engage in stress management strategies. Self-directed activities such as positive activity planning, mindfulness, and the like may be helpful. You may also wish to consider therapeutic treatment, which can help you to develop new coping skills, and different more adaptive ways of dealing with the stress that comes with caregiving for a loved one.   Test Findings  Test scores are summarized in additional documentation associated with this encounter. Test scores are relative to age, gender, and educational history as available and appropriate. There were no concerns about performance validity as all findings fell within normal expectations.   General Intellectual Functioning/Achievement:  Performance on single word reading was good, toward the top of the high average range, with average to high average presenting as good estimates of premorbid status.   Attention and Processing Efficiency: Performance on indicators of attention was good with digit repetition forward and backward in the high average range.   Timed indicators of processing speed generated reasonable scores with average timed number-symbol coding and simple numeric sequencing.   Language: Performance was normal on the fundamental ability of visual object confrontation naming. Generation of words in response to the letters F-A-S was average whereas generation of words in response to the category prompt "animals" was unusually low, an isolated low score of unclear significance.   Visuospatial Function: In the visuospatial realm, Shelby Andersen performed well with a high average score on the overall index and near errorless performance on figure copy and judgment of angular line orientations.   Learning and Memory: Performance was good on all measures within this domain. While she did  demonstrate some susceptibility to proactive interference on word list learning, it is an ancillary finding and she appears to be acquiring and retaining information well across time.   In the verbal realm, immediate recall for a 12-item word list was 3, 7, and 9 words across three learning trials followed by 6 words at short delayed recall and 7 words at long delayed recall, which are all decent scores. She did achieve unusually low immediate recall for a distractor alternate list interspersed between immediate and short delayed recall trials. Short story learning was average and delayed recall for the information was high average with nearly 100% retention. Daily living information memory was good with a high average (nearly superior score) on immediate and delayed recall. Recognition of the information was high average.   Executive Functions: Performance on executive measures was very good with a superior score on the [place] Composite of the [employer], a rule-based categorization procedure involving card sorting. Alternating sequencing of numbers and letters of the alphabet was average. Clock drawing was within normal limits with good formation of the face, number placement, and hand position. Reasoning with verbal information was average and errorless on the Complex Ideational Material. As above, she performed in the normal range when generating words in response to the letters F-A-S.   Rating Scale(s): Shelby Andersen screened negative for the presence of depression although as above, she has some affective issues related to caregiving. While the evaluation did not benefit from an informant, I was able to rate a CDR for her and her global score is 0.5, which is MCI, but mainly because she is complaining of memory loss (and she does not appear to have any psychometrically detectable  memory loss).  Viviano Simas Nicole Kindred PsyD, [place] Clinical Neuropsychologist

## 2020-09-10 ENCOUNTER — Other Ambulatory Visit: Payer: Self-pay | Admitting: Neurology

## 2020-09-20 HISTORY — PX: CATARACT EXTRACTION: SUR2

## 2020-09-20 HISTORY — PX: EYE SURGERY: SHX253

## 2020-09-20 HISTORY — PX: COSMETIC SURGERY: SHX468

## 2020-11-18 ENCOUNTER — Ambulatory Visit (INDEPENDENT_AMBULATORY_CARE_PROVIDER_SITE_OTHER): Payer: Medicare Other | Admitting: Neurology

## 2020-11-18 ENCOUNTER — Encounter: Payer: Self-pay | Admitting: Neurology

## 2020-11-18 ENCOUNTER — Other Ambulatory Visit: Payer: Self-pay

## 2020-11-18 VITALS — BP 108/51 | HR 74 | Ht 62.5 in | Wt 133.6 lb

## 2020-11-18 DIAGNOSIS — G44209 Tension-type headache, unspecified, not intractable: Secondary | ICD-10-CM | POA: Diagnosis not present

## 2020-11-18 DIAGNOSIS — F4323 Adjustment disorder with mixed anxiety and depressed mood: Secondary | ICD-10-CM

## 2020-11-18 MED ORDER — GABAPENTIN 100 MG PO CAPS: 100.0000 mg | ORAL_CAPSULE | Freq: Every day | ORAL | 11 refills | Status: DC

## 2020-11-18 NOTE — Progress Notes (Signed)
NEUROLOGY FOLLOW UP OFFICE NOTE  Shelby Andersen [id number] [birth year]  HISTORY OF PRESENT ILLNESS: I had the pleasure of seeing Shelby Andersen in follow-up in the neurology clinic on 11/18/2020.  The patient was last seen 7 months ago for memory loss and headache. Records and images were personally reviewed where available.  Neuropsychological evaluation in 05/2020 was normal, no indication of a neurodegenerative process. She may have interference from her stress level, numerous obligations, and grief, as well as subtle medication side effects (Ditropan) affecting cognition. She is off Ditropan and reports Myrbetriq helps with incontinence. She was started on nortriptyline 10mg  qhs on last visit for tension-type headaches. She stopped nortriptyline due to having a hungover sensation for 2 hours every morning. Excedrin migraine helped but caused stomach issues. She continues to have headaches with some kind of head pain all the time. No associated nausea/vomiting. She is under more stress than before with her husband's medical issues. She is on Sertraline 150mg  dailyShe describes a horrible shooting pain down from the vertex of her head going down the back or sides of her head. Sometimes it feels like a band with a stabbing sensation. She tries to get 8 hours of sleep, she is on Trazodone 50mg  qhs. She has occasional dizziness. No focal numbness/tingling/weakness. No falls. She is doing balance exercises regularly.    History on Initial Assessment 09/17/2019: This is a pleasant 70 year old right-handed woman with a history of hyperlipidemia, depression, presenting for evaluation of memory loss and headache. She started noticing memory changes a couple of years ago. She has been concerned that she goes totally blank while carrying on a conversation. She lives with her husband and manages finances without difficulties. She manages both their medications. She denies getting lost driving but has to think of  where she is going. Her husband denies any driving concerns. She has not left the stove on. She misplaces her phone frequently. Her son has told her she has repeated the same thing 3 times. Her mother had dementia. No history of significant head injuries or alcohol use. Sleep is not so good, she has difficulty with sleep initiation, in addition she wakes up 3 times to use the bathroom or help her husband when sugar levels go low. She had reduced Trazodone to 25mg  qhs. She states she cries a lot. There have been several deaths in the family since 2016 and her husband has been ill. She cries every morning during her quiet time, missing her mother. Once she gets through it, she is fine the rest of the day. She has been on Sertraline 100mg  daily for the past 2 years. No paranoia or hallucinations. She had been on pantoprazole for GERD which she stopped due to concern for causing memory loss, no improvement with discontinuation of medication.  She reports a history of 3 migraines in her adult life where she has light sensitivity and extreme nausea. On 07/06/2019, she woke up with excruciating pain different from typical headaches. She had pain on the vertex radiating down and around her head, worse in the occipital region with stiff neck. There was no nausea. She rates the pain as a "15/10" and called her friend to bring her to the hospital. She could not lay flat. Head CT was normal, morphine got the pain down. She states it has been 2 months but her head does not feel normal. There is a pressure around her head, with tenderness in the occipital regions. Neck is better after visiting her  chiropractor. She states it is not pain now, just discomfort. No family history of cerebral aneurysm. She gets dizzy quickly with positional changes, such as laying back on bed. No diplopia, dysarthria/dysphagia, anosmia, tremors. She has osteoarthritis with a lot of pain in her left arm, left hip, and left calf. She has pain in her  right hand and left wrist radiating to the thumb. No paresthesias. She reports 2 tick bites with a knot on her left leg, she has been told by an allergist that she was "borderline for chronic Lyme disease." She has low back pain worse in the morning. She has bladder incontinence. For the past 5 weeks, she has had diarrhea with 10-12 BM a day, occasional abdominal pain.   Diagnostic Data:  MRI and MRA brain without contrast done 09/2019 did not show any acute changes, normal MRA, mild chronic microvascular disease.  Neuropsychological evaluation in 05/2020 was normal, no indication of a neurodegenerative process. She may have interference from her stress level, numerous obligations, and grief, as well as subtle medication side effects (Ditropan) affecting cognition.   PAST MEDICAL HISTORY: Past Medical History:  Diagnosis Date  . Anemia   . Blood transfusion without reported diagnosis   . Cancer (HCC)    nose basal cell, leg basal cell  . Cataract    minimal  . Colitis 03/19/2020  . Depression   . Diverticulosis   . Dysphagia   . Elevated cholesterol   . Fibromyalgia   . GERD (gastroesophageal reflux disease)   . IBS (irritable bowel syndrome)   . Multiple gastric ulcers   . Osteoarthritis   . PONV (postoperative nausea and vomiting)     MEDICATIONS: Current Outpatient Medications on File Prior to Visit  Medication Sig Dispense Refill  . acetaminophen (TYLENOL) 650 MG CR tablet Take 1,300 mg by mouth every 8 (eight) hours as needed for pain.    . Cholecalciferol (VITAMIN D PO) Take 1 tablet by mouth daily.    . Coenzyme Q10 (COQ10) 200 MG CAPS Take by mouth daily.    Marland Kitchen loperamide (IMODIUM A-D) 2 MG tablet Take 1 tablet (2 mg total) by mouth daily. 30 tablet 0  . Melatonin 10 MG TABS Take by mouth at bedtime.    . Multiple Vitamins-Minerals (MULTIVITAMINS THER. W/MINERALS) TABS tablet Take 1 tablet by mouth daily.    . NON FORMULARY Magnilife Leg and Back pain relief PRN as needed     . pravastatin (PRAVACHOL) 40 MG tablet Take 40 mg by mouth once. Take one tab once daily    . sertraline (ZOLOFT) 100 MG tablet TAKE 1.5 TABLETS BY MOUTH DAILY 135 tablet 0  . traZODone (DESYREL) 50 MG tablet Take 50 mg by mouth at bedtime.     No current facility-administered medications on file prior to visit.    ALLERGIES: Allergies  Allergen Reactions  . Codeine     itching    FAMILY HISTORY: Family History  Problem Relation Age of Onset  . Heart disease Mother   . Heart disease Father   . Skin cancer Brother   . Heart disease Brother   . Colon cancer Maternal Uncle   . Diabetes Maternal Uncle   . Diabetes Maternal Aunt   . Diabetes Maternal Grandmother   . Esophageal cancer Neg Hx   . Stomach cancer Neg Hx   . Rectal cancer Neg Hx     SOCIAL HISTORY: Social History   Socioeconomic History  . Marital status: Married  Spouse name: Not on file  . Number of children: 1  . Years of education: Not on file  . Highest education level: Not on file  Occupational History  . Occupation: retired  Tobacco Use  . Smoking status: Never Smoker  . Smokeless tobacco: Never Used  Vaping Use  . Vaping Use: Never used  Substance and Sexual Activity  . Alcohol use: Yes    Alcohol/week: 0.0 standard drinks    Comment: rare  . Drug use: No  . Sexual activity: Not Currently    Partners: Male  Other Topics Concern  . Not on file  Social History Narrative   Right handed      Lives with husband in two story home. Stays on first floor      2 years of college edu      Drinks Caffeine. Coffee and Tea   Social Determinants of Radio broadcast assistant Strain: Not on file  Food Insecurity: Not on file  Transportation Needs: Not on file  Physical Activity: Not on file  Stress: Not on file  Social Connections: Not on file  Intimate Partner Violence: Not on file     PHYSICAL EXAM: Vitals:   11/18/20 1435  BP: (!) 108/51  Pulse: 74  SpO2: 97%   General: No  acute distress Head:  Normocephalic/atraumatic Skin/Extremities: No rash, no edema Neurological Exam: alert and awake. No aphasia or dysarthria. Fund of knowledge is appropriate.  Recent and remote memory are intact.  Attention and concentration are normal.   Cranial nerves: Pupils equal, round. Extraocular movements intact with no nystagmus. Visual fields full.  No facial asymmetry.  Motor: Bulk and tone normal, muscle strength 5/5 throughout with no pronator drift.   Finger to nose testing intact.  Gait narrow-based and steady, able to tandem walk adequately.  Romberg negative.   IMPRESSION: This is a pleasant 70 yo RH woman with a history of hyperlipidemia, depression, who presented for memory loss and headaches. MRI/MRA brain no acute changes, there is mild chronic microvascular disease.Neuropsychological evaluation in 05/2020 was normal, no indication of a neurodegenerative process. She may have interference from her stress level, numerous obligations, and grief, as well as subtle medication side effects (Ditropan) affecting cognition. She is off Ditropan and continues to report a significant amount of caregiver stress. She is agreeable to seeing a Social worker. She continues to report daily headaches suggestive of tension type headaches, she is agreeable to starting a different headache preventative medication gabapentin 100mg  qhs, side effects discussed. We may uptitrate dose as tolerated. Follow-up in 6-8 months, she knows to call for any changes.   Thank you for allowing me to participate in her care.  Please do not hesitate to call for any questions or concerns.   Ellouise Newer, M.D.   CC: Dr. Sallee Provencal

## 2020-11-18 NOTE — Patient Instructions (Signed)
1. Start Gabapentin 100mg : take 1 capsule every evening. Call our office for an update in a month  2. Refer to Myra Gianotti for counseling  3. Follow-up in 6-8 months, call for any changes   RECOMMENDATIONS FOR ALL PATIENTS WITH MEMORY PROBLEMS: 1. Continue to exercise (Recommend 30 minutes of walking everyday, or 3 hours every week) 2. Increase social interactions - continue going to [place] and enjoy social gatherings with friends and family 3. Eat healthy, avoid fried foods and eat more fruits and vegetables 4. Maintain adequate blood pressure, blood sugar, and blood cholesterol level. Reducing the risk of stroke and cardiovascular disease also helps promoting better memory. 5. Avoid stressful situations. Live a simple life and avoid aggravations. Organize your time and prepare for the next day in anticipation. 6. Sleep well, avoid any interruptions of sleep and avoid any distractions in the bedroom that may interfere with adequate sleep quality 7. Avoid sugar, avoid sweets as there is a strong link between excessive sugar intake, diabetes, and cognitive impairment The Mediterranean diet has been shown to help patients reduce the risk of progressive memory disorders and reduces cardiovascular risk. This includes eating fish, eat fruits and green leafy vegetables, nuts like almonds and hazelnuts, walnuts, and also use olive oil. Avoid fast foods and fried foods as much as possible. Avoid sweets and sugar as sugar use has been linked to worsening of memory function.

## 2020-12-15 ENCOUNTER — Telehealth: Payer: Self-pay

## 2020-12-15 ENCOUNTER — Telehealth: Payer: Self-pay | Admitting: Neurology

## 2020-12-15 NOTE — Telephone Encounter (Signed)
Sent to [facility] for answers.

## 2020-12-15 NOTE — Telephone Encounter (Signed)
I discussed all of this with her. There is nothing that is fatal in her MRI or MRA. The white matter changes are not significant and it is hardening of the small blood vessels typically seen in people who have blood pressure or cholesterol issues. It is not an issue of blood supply,she has blood supply to the brain, there is no narrowing or blockage seen. If she is asking about her memory condition, she does not have dementia. Her headaches are tension-type headaches, with neck stiffness would refer to Physical therapy to work on her neck muscles. Thanks

## 2020-12-15 NOTE — Telephone Encounter (Signed)
Patient called to ask questions about her MRI, and MRA. Has a lot of questions.  1. Is this fatal on her report? 2.white Matter normal? 3. Why is there not enough blood supply? 4. Neck stiffness continues. 5. Cutting back no sugar, desert at least once weekly, 6. What to know exactly what her condition is and slow progression.

## 2020-12-15 NOTE — Telephone Encounter (Signed)
After viewing her results in MyChart of her MRI and her MRA, the patient has some follow up questions.  Patient is due for a refill of her gabapentin. She said she has noticed an improvement but wants to know if Dr. Delice Lesch will increase it for better treatment.

## 2020-12-16 ENCOUNTER — Telehealth: Payer: Self-pay

## 2020-12-16 ENCOUNTER — Other Ambulatory Visit: Payer: Self-pay

## 2020-12-16 NOTE — Telephone Encounter (Signed)
Ok to increase gabapentin 100mg : Take 2 caps qhs. Pls send in updated Rx, thanks

## 2020-12-16 NOTE — Telephone Encounter (Signed)
Patient is gabapentin 100mg  wants to increase, taken at hs, can you increase dose.

## 2020-12-17 ENCOUNTER — Other Ambulatory Visit: Payer: Self-pay

## 2020-12-17 MED ORDER — GABAPENTIN 100 MG PO CAPS: 100.0000 mg | ORAL_CAPSULE | Freq: Every day | ORAL | 2 refills | Status: DC

## 2020-12-18 ENCOUNTER — Other Ambulatory Visit: Payer: Self-pay

## 2020-12-18 MED ORDER — GABAPENTIN 100 MG PO CAPS: 100.0000 mg | ORAL_CAPSULE | Freq: Two times a day (BID) | ORAL | 2 refills | Status: DC

## 2020-12-18 NOTE — Telephone Encounter (Signed)
Patient advised and routed to Dr.Aquino to sign Rx.

## 2020-12-23 NOTE — Telephone Encounter (Signed)
Close encounter 

## 2021-04-14 ENCOUNTER — Other Ambulatory Visit: Payer: Self-pay | Admitting: Neurology

## 2021-04-14 ENCOUNTER — Other Ambulatory Visit: Payer: Self-pay

## 2021-04-14 MED ORDER — GABAPENTIN 100 MG PO CAPS: ORAL_CAPSULE | ORAL | 3 refills | Status: DC

## 2021-07-29 ENCOUNTER — Ambulatory Visit: Payer: Medicare Other | Admitting: Neurology

## 2021-08-03 ENCOUNTER — Other Ambulatory Visit: Payer: Self-pay

## 2021-08-03 ENCOUNTER — Ambulatory Visit (INDEPENDENT_AMBULATORY_CARE_PROVIDER_SITE_OTHER): Payer: Medicare Other | Admitting: Neurology

## 2021-08-03 ENCOUNTER — Encounter: Payer: Self-pay | Admitting: Neurology

## 2021-08-03 VITALS — BP 144/85 | HR 85 | Ht 62.0 in | Wt 132.2 lb

## 2021-08-03 DIAGNOSIS — R413 Other amnesia: Secondary | ICD-10-CM | POA: Diagnosis not present

## 2021-08-03 DIAGNOSIS — G44209 Tension-type headache, unspecified, not intractable: Secondary | ICD-10-CM

## 2021-08-03 DIAGNOSIS — F4323 Adjustment disorder with mixed anxiety and depressed mood: Secondary | ICD-10-CM | POA: Diagnosis not present

## 2021-08-03 MED ORDER — GABAPENTIN 100 MG PO CAPS: ORAL_CAPSULE | ORAL | 11 refills | Status: DC

## 2021-08-03 NOTE — Patient Instructions (Signed)
Increase Gabapentin 100mg : take 1 capsule in AM, 2 capsules in PM  2. I strongly recommend getting more help at home so you can start with physical therapy to help with neck pain/headaches  3. If you would like to contact [facility] to speak to a counselor, contact [phone]. I think seeing a grief counselor through the Cancer center would be more beneficial.  4. Follow-up in 6 months, call for any changes.

## 2021-08-03 NOTE — Progress Notes (Signed)
NEUROLOGY FOLLOW UP OFFICE NOTE  Chantel Teti [id number] 1951/09/10  HISTORY OF PRESENT ILLNESS: I had the pleasure of seeing Fidela Cieslak in follow-up in the neurology clinic on 08/03/2021. She is again accompanied by her good friend who helps supplement the history today. The patient was last seen 8 months ago for memory loss and headaches. Neuropsychological evaluation in 05/2020 was normal, no indication of a neurodegenerative process. She may have interference from her stress level, numerous obligations, and grief, as well as subtle medication side effects (Ditropan) affecting cognition. Ditropan was discontinued. She continues to deal with significant caregiver stress, she says she cannot leave her husband alone. She states she cries for 30-45 minutes every morning. Her son tells her she repeats the same questions. It drives her insane constantly looking for her glasses, keys, anything she picks up she cannot find. She notes a lot of brain fog, difficulty focusing. She continues to have daily headaches. She was started on gabapentin 100mg  BID on last visit, no side effects. She states she always has head pain, she wakes up with aching in the frontal region, there is a heavy pressure in the right occipital region with sharp pain every now and then. Sometimes it goes down the base of her neck. At times it feels like her brain is vibrating inside her skull. If she does not exercise her neck, it gets really stiff. Over the past week, when she lays down she has had to keep her head elevated but as soon as she lays down she has to hold the sides of her head, "not the room spinning, just me," lasting 30-60 minutes. She closes her eyes and still feels the sensation. She had left eye surgery recently and has not been sleeping well. She notes her balance is not as good, she has some back pain, no falls.   History on Initial Assessment 09/17/2019: This is a pleasant 70 year old right-handed woman with a  history of hyperlipidemia, depression, presenting for evaluation of memory loss and headache. She started noticing memory changes a couple of years ago. She has been concerned that she goes totally blank while carrying on a conversation. She lives with her husband and manages finances without difficulties. She manages both their medications. She denies getting lost driving but has to think of where she is going. Her husband denies any driving concerns. She has not left the stove on. She misplaces her phone frequently. Her son has told her she has repeated the same thing 3 times. Her mother had dementia. No history of significant head injuries or alcohol use. Sleep is not so good, she has difficulty with sleep initiation, in addition she wakes up 3 times to use the bathroom or help her husband when sugar levels go low. She had reduced Trazodone to 25mg  qhs. She states she cries a lot. There have been several deaths in the family since 2016 and her husband has been ill. She cries every morning during her quiet time, missing her mother. Once she gets through it, she is fine the rest of the day. She has been on Sertraline 100mg  daily for the past 2 years. No paranoia or hallucinations. She had been on pantoprazole for GERD which she stopped due to concern for causing memory loss, no improvement with discontinuation of medication.  She reports a history of 3 migraines in her adult life where she has light sensitivity and extreme nausea. On 07/06/2019, she woke up with excruciating pain different from typical headaches.  She had pain on the vertex radiating down and around her head, worse in the occipital region with stiff neck. There was no nausea. She rates the pain as a "15/10" and called her friend to bring her to the hospital. She could not lay flat. Head CT was normal, morphine got the pain down. She states it has been 2 months but her head does not feel normal. There is a pressure around her head, with tenderness  in the occipital regions. Neck is better after visiting her chiropractor. She states it is not pain now, just discomfort. No family history of cerebral aneurysm. She gets dizzy quickly with positional changes, such as laying back on bed. No diplopia, dysarthria/dysphagia, anosmia, tremors. She has osteoarthritis with a lot of pain in her left arm, left hip, and left calf. She has pain in her right hand and left wrist radiating to the thumb. No paresthesias. She reports 2 tick bites with a knot on her left leg, she has been told by an allergist that she was "borderline for chronic Lyme disease." She has low back pain worse in the morning. She has bladder incontinence. For the past 5 weeks, she has had diarrhea with 10-12 BM a day, occasional abdominal pain.   Diagnostic Data:  MRI and MRA brain without contrast done 09/2019 did not show any acute changes, normal MRA, mild chronic microvascular disease.  Neuropsychological evaluation in 05/2020 was normal, no indication of a neurodegenerative process. She may have interference from her stress level, numerous obligations, and grief, as well as subtle medication side effects (Ditropan) affecting cognition.   Prior headache preventative medications: nortriptyline (hungover sensation)   PAST MEDICAL HISTORY: Past Medical History:  Diagnosis Date   Anemia    Blood transfusion without reported diagnosis    Cancer ([facility])    nose basal cell, leg basal cell   Cataract    minimal   Colitis 03/19/2020   Depression    Diverticulosis    Dysphagia    Elevated cholesterol    Fibromyalgia    GERD (gastroesophageal reflux disease)    IBS (irritable bowel syndrome)    Multiple gastric ulcers    Osteoarthritis    PONV (postoperative nausea and vomiting)     MEDICATIONS: Current Outpatient Medications on File Prior to Visit  Medication Sig Dispense Refill   acetaminophen (TYLENOL) 650 MG CR tablet Take 1,300 mg by mouth every 8 (eight) hours as needed for  pain.     Cholecalciferol (VITAMIN D PO) Take 1 tablet by mouth daily.     Coenzyme Q10 (COQ10) 200 MG CAPS Take by mouth daily.     gabapentin (NEURONTIN) 100 MG capsule TAKE 1 CAPSULE(100 MG) BY MOUTH TWICE DAILY 60 capsule 3   loperamide (IMODIUM A-D) 2 MG tablet Take 1 tablet (2 mg total) by mouth daily. 30 tablet 0   Melatonin 10 MG TABS Take by mouth at bedtime.     Multiple Vitamins-Minerals (MULTIVITAMINS THER. W/MINERALS) TABS tablet Take 1 tablet by mouth daily.     NON FORMULARY Magnilife Leg and Back pain relief PRN as needed     pravastatin (PRAVACHOL) 40 MG tablet Take 40 mg by mouth once. Take one tab once daily     sertraline (ZOLOFT) 100 MG tablet TAKE 1.5 TABLETS BY MOUTH DAILY 135 tablet 0   traZODone (DESYREL) 50 MG tablet Take 50 mg by mouth at bedtime.     No current facility-administered medications on file prior to visit.  ALLERGIES: Allergies  Allergen Reactions   Codeine     itching    FAMILY HISTORY: Family History  Problem Relation Age of Onset   Heart disease Mother    Heart disease Father    Skin cancer Brother    Heart disease Brother    Colon cancer Maternal Uncle    Diabetes Maternal Uncle    Diabetes Maternal Aunt    Diabetes Maternal Grandmother    Esophageal cancer Neg Hx    Stomach cancer Neg Hx    Rectal cancer Neg Hx     SOCIAL HISTORY: Social History   Socioeconomic History   Marital status: Married    Spouse name: Not on file   Number of children: 1   Years of education: Not on file   Highest education level: Not on file  Occupational History   Occupation: retired  Tobacco Use   Smoking status: Never   Smokeless tobacco: Never  Vaping Use   Vaping Use: Never used  Substance and Sexual Activity   Alcohol use: Yes    Alcohol/week: 0.0 standard drinks    Comment: rare   Drug use: No   Sexual activity: Not Currently    Partners: Male  Other Topics Concern   Not on file  Social History Narrative   Right handed       Lives with husband in two story home. Stays on first floor      2 years of college edu      Drinks Caffeine. Coffee and Tea   Social Determinants of Radio broadcast assistant Strain: Not on file  Food Insecurity: Not on file  Transportation Needs: Not on file  Physical Activity: Not on file  Stress: Not on file  Social Connections: Not on file  Intimate Partner Violence: Not on file     PHYSICAL EXAM: Vitals:   08/03/21 0947  BP: (!) 144/85  Pulse: 85  SpO2: 97%   General: No acute distress, she became tearful Head/Neck:  Normocephalic/atraumatic, +tenderness in right occipital region, significant neck tension Skin/Extremities: No rash, no edema Neurological Exam: alert and awake. No aphasia or dysarthria. Fund of knowledge is appropriate.  Attention and concentration are normal.   Cranial nerves: Pupils equal, round. Extraocular movements intact with no nystagmus. Visual fields full.  No facial asymmetry.  Motor: Bulk and tone normal, muscle strength 5/5 throughout with no pronator drift.   Finger to nose testing intact.  Gait narrow-based and steady, no ataxia   IMPRESSION: This is a pleasant 70 yo RH woman with a history of hyperlipidemia, depression, who presented for memory loss and headaches. MRI/MRA brain no acute changes, there is mild chronic microvascular disease.Neuropsychological evaluation in 05/2020 was normal, no indication of a neurodegenerative process. She may have interference from her stress level, numerous obligations, and grief, as well as subtle medication side effects (Ditropan) affecting cognition. She is now off Ditropan. She continues to report cognitive changes and daily headaches. We again discussed how significant caregiver stress is affecting her cognition, she will benefit from getting more help at home as well as seeing a grief counselor. Discussed headaches are likely tension-type or cervicogenic, she will benefit from doing physical therapy for  neck pain/headaches. We discussed increasing gabapentin for symptomatic treatment to 100mg  in AM, 200mg  in PM, monitoring side effects as this can also affect cognition. Caregiver support provided. Follow-up in 6 months, call for any changes.    Thank you for allowing me to participate in her  care.  Please do not hesitate to call for any questions or concerns.  Ellouise Newer, M.D.   CC: Dr. Sallee Provencal

## 2021-10-04 IMAGING — MR MR MRA HEAD W/O CM
11 series · 43 of 48 positions shown · non-contrast
Comparison: None.

CLINICAL DATA: [place]clap headache

EXAM:
MRI HEAD WITHOUT CONTRAST
MRA HEAD WITHOUT CONTRAST
TECHNIQUE: Multiplanar, multiecho pulse sequences of the brain and surrounding
structures were obtained without intravenous contrast. Angiographic
images of the head were obtained using MRA technique without
contrast.

[Series 2: T1 · sagittal · 5.0mm · 0.45mm/px · 2 of 21 slices shown]
[im 1/21]
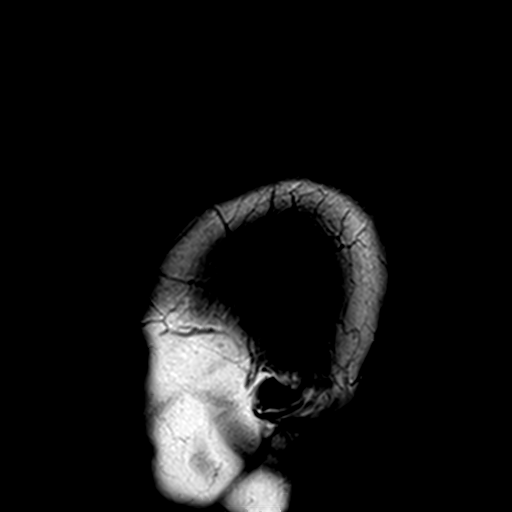
[im 21/21]
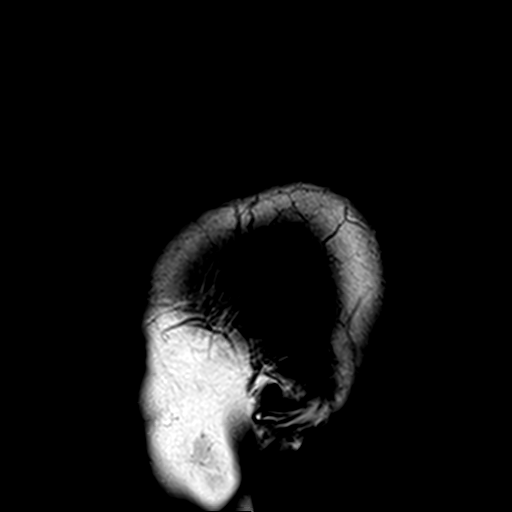

[Series 3: tof_3d_multi-slab · axial · 0.7mm · 0.35mm/px · z∈[-40,+53]mm · 8 of 136 slices shown]
[im 1/136]
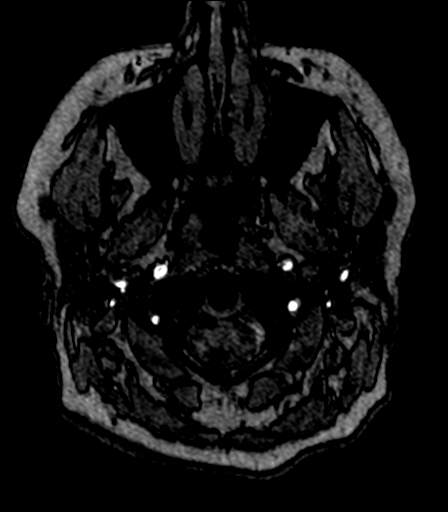
[im 16/136]
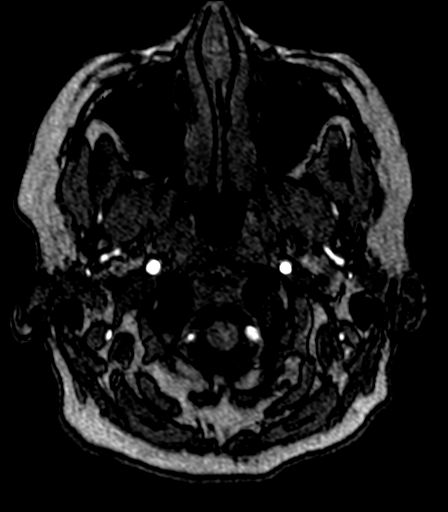
[im 46/136]
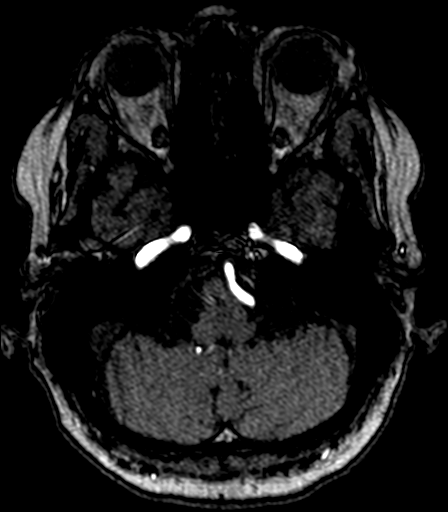
[im 61/136]
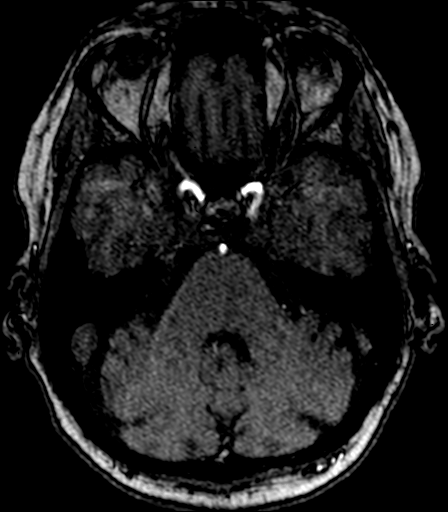
[im 76/136]
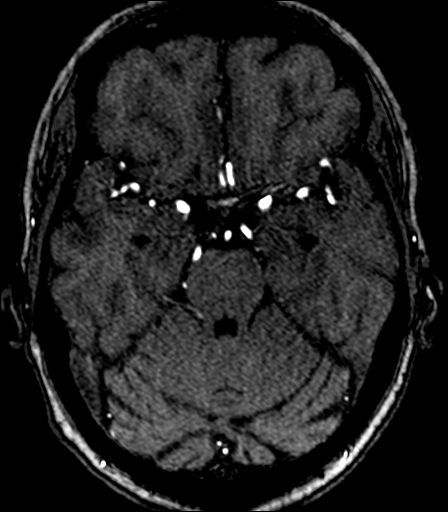
[im 91/136]
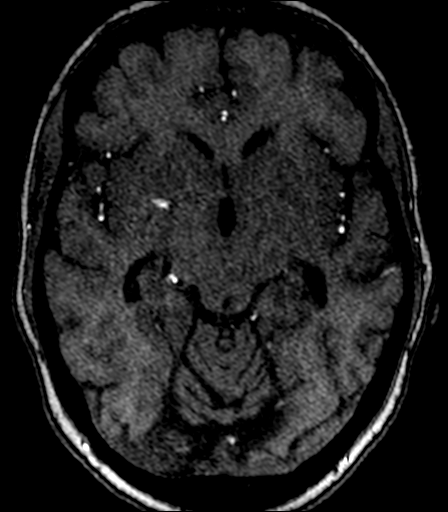
[im 121/136]
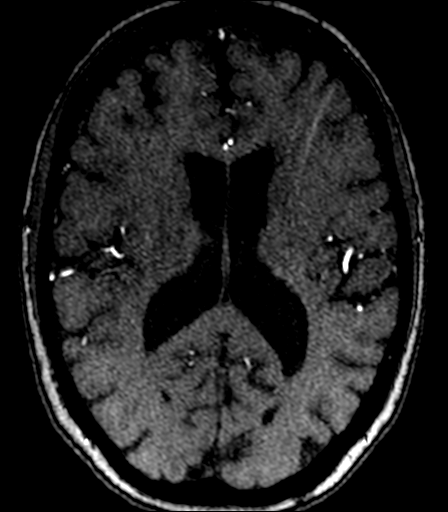
[im 136/136]
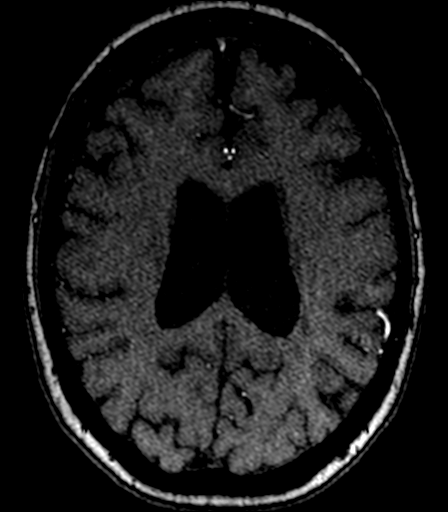

[Series 4: DWI · axial · 3.0mm · 1.80mm/px · z∈[-64,+77]mm · 7 of 96 slices shown (1 of 4)]
[im 1/96]
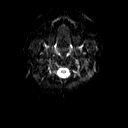
[im 16/96]
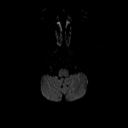
[im 32/96]
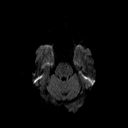
[im 48/96]
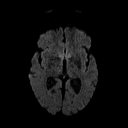
[im 64/96]
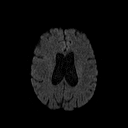
[im 80/96]
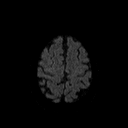
[im 96/96]
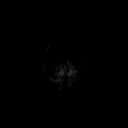

[Series 5: DWI · axial · 3.0mm · 1.80mm/px · z∈[-64,+77]mm · 3 of 46 slices shown (2 of 4)]
[im 1/46]
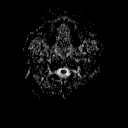
[im 23/46]
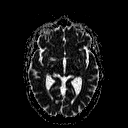
[im 46/46]
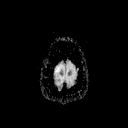

[Series 6: DWI · coronal · 5.0mm · 1.80mm/px · 4 of 60 slices shown (3 of 4)]
[im 1/60]
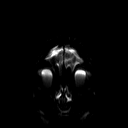
[im 20/60]
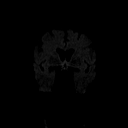
[im 40/60]
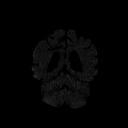
[im 60/60]
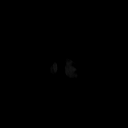

[Series 7: DWI · coronal · 5.0mm · 1.80mm/px · 2 of 32 slices shown (4 of 4)]
[im 1/32]
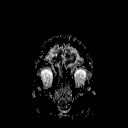
[im 32/32]
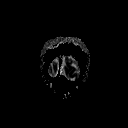

[Series 8: t1_mpr_tra · axial · 1.0mm · 0.75mm/px · z∈[-70,+80]mm · 8 of 144 slices shown]
[im 1/144]
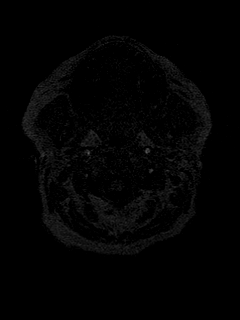
[im 29/144]
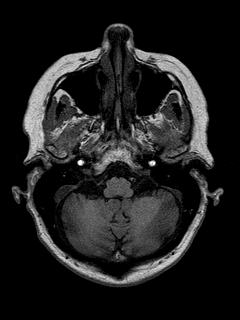
[im 43/144]
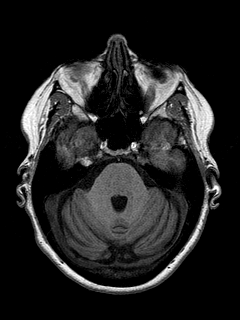
[im 58/144]
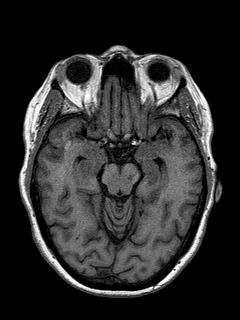
[im 86/144]
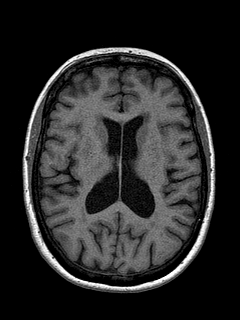
[im 101/144]
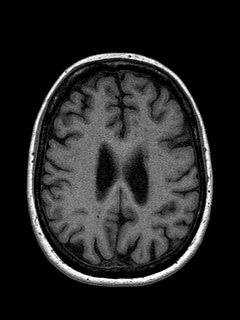
[im 115/144]
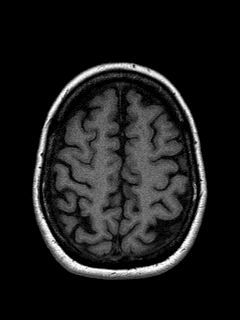
[im 144/144]
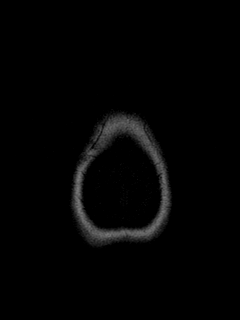

[Series 9: T2 · axial · 5.0mm · 0.60mm/px · z∈[-66,+77]mm · 2 of 24 slices shown (1 of 2)]
[im 1/24]
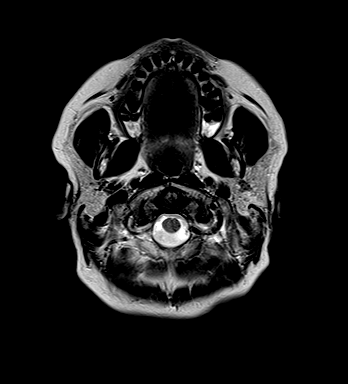
[im 24/24]
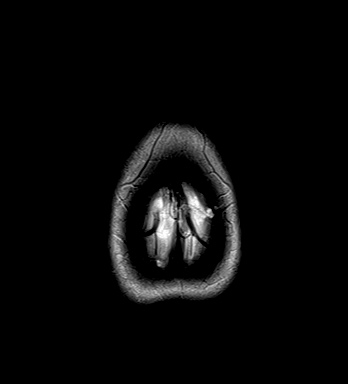

[Series 12: FLAIR · axial · 3.0mm · 0.45mm/px · z∈[-66,+78]mm · 2 of 30 slices shown]
[im 1/30]
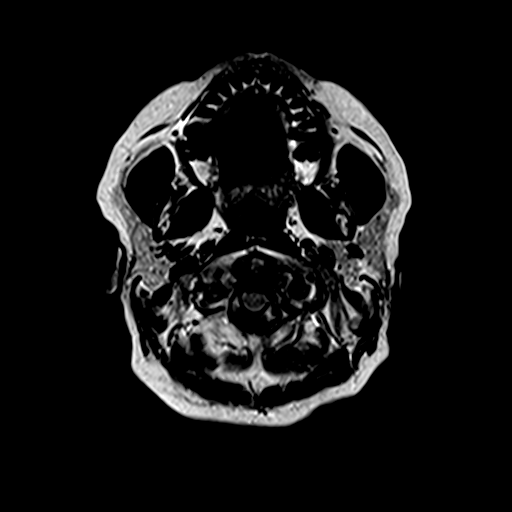
[im 30/30]
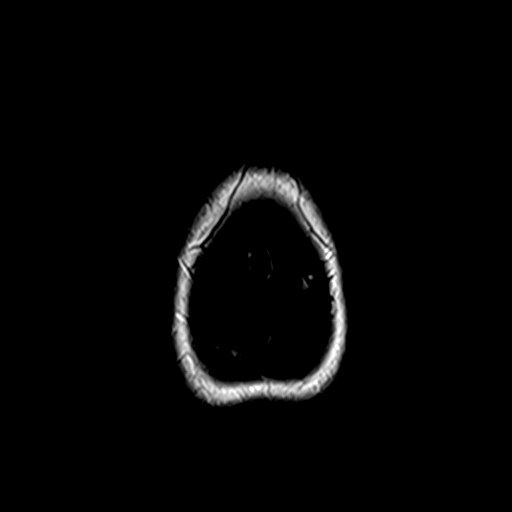

[Series 17: swi_images · axial · 4.0mm · 0.90mm/px · z∈[-73,+83]mm · 3 of 40 slices shown]
[im 1/40]
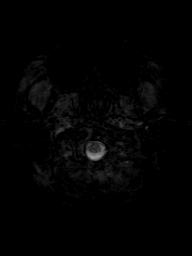
[im 20/40]
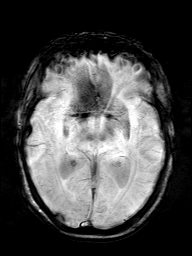
[im 40/40]
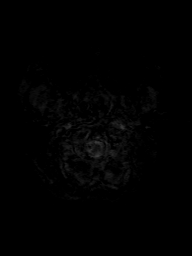

[Series 18: T2 · coronal · 5.0mm · 0.45mm/px · 2 of 26 slices shown (2 of 2)]
[im 1/26]
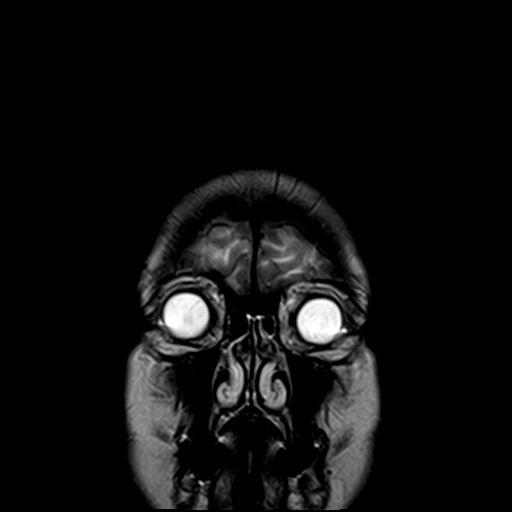
[im 26/26]
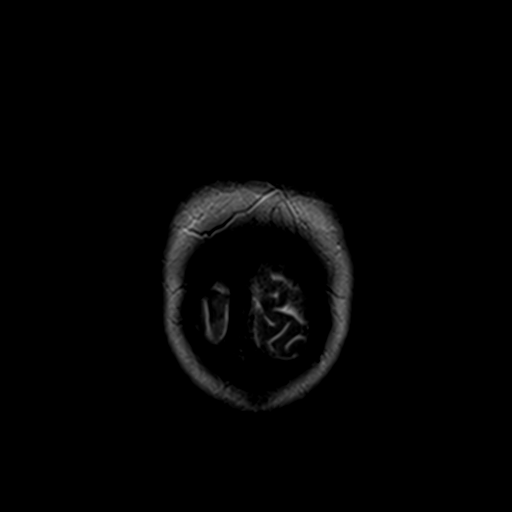

[43 of 48 positions shown; findings below may reference images not displayed]

FINDINGS: MRI HEAD FINDINGS

BRAIN: There is no acute infarct, acute hemorrhage or extra-axial
collection. Multifocal white matter hyperintensity, most commonly
due to chronic ischemic microangiopathy. The cerebral and cerebellar
volume are age-appropriate. There is no hydrocephalus.
Blood-sensitive sequences show no chronic microhemorrhage or
superficial siderosis. The midline structures are normal.

SKULL AND UPPER CERVICAL SPINE: The visualized skull base,
calvarium, upper cervical spine and extracranial soft tissues are
normal.

SINUSES/ORBITS: No fluid levels or advanced mucosal thickening. No
mastoid or middle ear effusion. The orbits are normal.

MRA HEAD FINDINGS

POSTERIOR CIRCULATION:

--Basilar artery: Normal.

--Posterior cerebral arteries: Normal. Both originate from the
basilar artery.

--Superior cerebellar arteries: Normal.

--Inferior cerebellar arteries: Normal anterior and posterior
inferior cerebellar arteries.

ANTERIOR CIRCULATION:

--Intracranial internal carotid arteries: Normal.

--Anterior cerebral arteries: Normal. Both A1 segments are present.
Patent anterior communicating artery.

--Middle cerebral arteries: Normal.

--Posterior communicating arteries: Right p-comm gives rise to the
right PCA. Diminutive left p-comm is visualized.
IMPRESSION: 1. No acute intracranial abnormality.
2. Normal intracranial MRA.
3. Mild white matter disease, most commonly due to chronic ischemic
microangiopathy.

## 2021-11-06 ENCOUNTER — Ambulatory Visit (INDEPENDENT_AMBULATORY_CARE_PROVIDER_SITE_OTHER): Payer: Medicare Other | Admitting: Physician Assistant

## 2021-11-06 ENCOUNTER — Encounter: Payer: Self-pay | Admitting: Physician Assistant

## 2021-11-06 VITALS — BP 120/60 | HR 66 | Wt 133.2 lb

## 2021-11-06 DIAGNOSIS — R142 Eructation: Secondary | ICD-10-CM | POA: Diagnosis not present

## 2021-11-06 DIAGNOSIS — R1013 Epigastric pain: Secondary | ICD-10-CM

## 2021-11-06 DIAGNOSIS — K219 Gastro-esophageal reflux disease without esophagitis: Secondary | ICD-10-CM | POA: Diagnosis not present

## 2021-11-06 MED ORDER — OMEPRAZOLE 40 MG PO CPDR: 40.0000 mg | DELAYED_RELEASE_CAPSULE | Freq: Two times a day (BID) | ORAL | 5 refills | Status: DC

## 2021-11-06 MED ORDER — FAMOTIDINE 40 MG PO TABS: 40.0000 mg | ORAL_TABLET | Freq: Two times a day (BID) | ORAL | 5 refills | Status: DC

## 2021-11-06 NOTE — Patient Instructions (Addendum)
We have sent the following medications to your pharmacy for you to pick up at your convenience: Omeprazole 40 mg twice daily 30-60 minutes before breakfast and dinner.  Famotidine 20 mg 30-60 minutes before breakfast and bedtime.   You have been scheduled for an endoscopy. Please follow written instructions given to you at your visit today. If you use inhalers (even only as needed), please bring them with you on the day of your procedure.  If you are age 73 or older, your body mass index should be between 23-30. Your Body mass index is 24.36 kg/m. If this is out of the aforementioned range listed, please consider follow up with your Primary Care Provider.  If you are age 6 or younger, your body mass index should be between 19-25. Your Body mass index is 24.36 kg/m. If this is out of the aformentioned range listed, please consider follow up with your Primary Care Provider.   ________________________________________________________  The [facility] providers would like to encourage you to use [facility] to communicate with providers for non-urgent requests or questions.  Due to long hold times on the telephone, sending your provider a message by [facility] may be a faster and more efficient way to get a response.  Please allow 48 business hours for a response.  Please remember that this is for non-urgent requests.  _______________________________________________________

## 2021-11-06 NOTE — Progress Notes (Signed)
Chief Complaint: Follow-up ER visit for epigastric pain  HPI:    Shelby Andersen is a 71 year old female, known to Dr. Ardis Hughs, with a past medical history as listed below including fibromyalgia, reflux and IBS for follow-up after being seen in the ER for epigastric pain.      04/21/2016 EGD with Dr. 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  Biopsies were negative for EOE.  Her swallowing had improved.    03/19/2020 colonoscopy with diverticulosis and otherwise normal.  Biopsies showed collagenous colitis.  At that time she was told to continue her single scheduled Imodium every morning as it was helping.    11/01/2021 patient seen in the ER at [facility] for epigastric pain.  Patient described been going on for a couple of weeks and 2 nights ago it was sharp and associated with nausea and vomiting also some loose stool which was dark in color.  Apparently had been to a different ED the day before with normal CBC and CMP and the right upper quadrant ultrasound was without stones or evidence of cholecystitis.  CT AP with contrast with no acute findings.  UA with leuks and pyorrhea, sent home with Famotidine and Keflex for UTI.  Since then he continued to have pain.  Radiated to her mid back and was achy.  Pain and been improved after starting Protonix, Famotidine, morphine and Zofran.  Described a history of gastritis and ulcers in the past and felt it was similar.  Had apparently discontinued her PPI a year ago.    Today, the patient explains that she has had epigastric pain which seemed to worsen about a week ago and was very sharp and related with a lot of nausea and some vomiting as well as loose stool and eructations.  Tells me she has a history of reflux and gastritis and was on Pantoprazole 40 for many years but stopped this 2 years ago after reading that it may lead to dementia.  Tells me she already has some mild cognitive impairment and parents with  Alzheimer's and did not want to increase her risk of this for her son.  Since being seen in the ER she has been on Omeprazole 20 mg every morning and 40 mg nightly as well as Famotidine 20 mg twice daily.  She continues with some epigastric discomfort which is worse after eating just about anything.  Also continues with some loose stools.    Denies fever, chills, weight loss, blood in her stool or symptoms that awaken her from sleep.  Past Medical History:  Diagnosis Date   Anemia    Blood transfusion without reported diagnosis    Cancer ([place])    nose basal cell, leg basal cell   Cataract    minimal   Colitis 03/19/2020   Depression    Diverticulosis    Dysphagia    Elevated cholesterol    Fibromyalgia    GERD (gastroesophageal reflux disease)    IBS (irritable bowel syndrome)    Multiple gastric ulcers    Osteoarthritis    PONV (postoperative nausea and vomiting)     Past Surgical History:  Procedure Laterality Date   CATARACT EXTRACTION  2022   COLONOSCOPY  2010   [place]   COSMETIC SURGERY  2022   face   DILATION AND CURETTAGE OF UTERUS     ESOPHAGEAL DILATION N/A 04/21/2016   Procedure: ESOPHAGEAL DILATION;  Surgeon: Rogene Houston, MD;  Location: [facility]  ENDO SUITE;  Service: Endoscopy;  Laterality: N/A;   ESOPHAGOGASTRODUODENOSCOPY N/A 04/21/2016   Procedure: ESOPHAGOGASTRODUODENOSCOPY (EGD);  Surgeon: Rogene Houston, MD;  Location: [facility] ENDO SUITE;  Service: Endoscopy;  Laterality: N/A;  2:25   EYE SURGERY Left 2022   [place]   FOOT SURGERY      Current Outpatient Medications  Medication Sig Dispense Refill   acetaminophen (TYLENOL) 650 MG CR tablet Take 1,300 mg by mouth every 8 (eight) hours as needed for pain.     Cholecalciferol (VITAMIN D PO) Take 1 tablet by mouth daily.     Coenzyme Q10 (COQ10) 200 MG CAPS Take by mouth daily.     doxycycline (ADOXA) 100 MG tablet Take 100 mg by mouth 2 (two) times daily.     gabapentin (NEURONTIN) 100 MG capsule Take 1 capsule  in AM, 2 capsules in PM 90 capsule 11   loperamide (IMODIUM A-D) 2 MG tablet Take 1 tablet (2 mg total) by mouth daily. 30 tablet 0   MAGNESIUM CITRATE PO Take by mouth.     Multiple Vitamins-Minerals (MULTIVITAMINS THER. W/MINERALS) TABS tablet Take 1 tablet by mouth daily.     NON FORMULARY Magnilife Leg and Back pain relief PRN as needed     pravastatin (PRAVACHOL) 40 MG tablet Take 40 mg by mouth once. Take one tab once daily     sertraline (ZOLOFT) 100 MG tablet TAKE 1.5 TABLETS BY MOUTH DAILY 135 tablet 0   traZODone (DESYREL) 50 MG tablet Take 50 mg by mouth at bedtime.     No current facility-administered medications for this visit.    Allergies as of 11/06/2021 - Review Complete 08/03/2021  Allergen Reaction Noted   Codeine  02/27/2016    Family History  Problem Relation Age of Onset   Heart disease Mother    Heart disease Father    Skin cancer Brother    Heart disease Brother    Colon cancer Maternal Uncle    Diabetes Maternal Uncle    Diabetes Maternal Aunt    Diabetes Maternal Grandmother    Esophageal cancer Neg Hx    Stomach cancer Neg Hx    Rectal cancer Neg Hx     Social History   Socioeconomic History   Marital status: Married    Spouse name: Not on file   Number of children: 1   Years of education: Not on file   Highest education level: Not on file  Occupational History   Occupation: retired  Tobacco Use   Smoking status: Never   Smokeless tobacco: Never  Vaping Use   Vaping Use: Never used  Substance and Sexual Activity   Alcohol use: Yes    Alcohol/week: 0.0 standard drinks    Comment: rare   Drug use: No   Sexual activity: Not Currently    Partners: Male  Other Topics Concern   Not on file  Social History Narrative   Right handed      Lives with husband in two story home. Stays on first floor      2 years of college edu      Drinks Caffeine. Coffee and Tea   Social Determinants of Health   Financial Resource Strain: Not on file   Food Insecurity: Not on file  Transportation Needs: Not on file  Physical Activity: Not on file  Stress: Not on file  Social Connections: Not on file  Intimate Partner Violence: Not on file    Review of Systems:  Constitutional: No weight loss, fever or chills Cardiovascular: No chest pain Respiratory: No SOB  Gastrointestinal: See HPI and otherwise negative   Physical Exam:  Vital signs: BP 120/60    Pulse 66    Wt 133 lb 3.2 oz (60.4 kg)    SpO2 97%    BMI 24.36 kg/m    Constitutional:   Pleasant Elderly Caucasian female appears to be in NAD, Well developed, Well nourished, alert and cooperative Respiratory: Respirations even and unlabored. Lungs clear to auscultation bilaterally.   No wheezes, crackles, or rhonchi.  Cardiovascular: Normal S1, S2. No MRG. Regular rate and rhythm. No peripheral edema, cyanosis or pallor.  Gastrointestinal:  Soft, nondistended, mild-moderate epigastric TTP, no rebound or guarding. Normal bowel sounds. No appreciable masses or hepatomegaly. Rectal:  Not performed.  Psychiatric: Oriented to person, place and time. Demonstrates good judgement and reason without abnormal affect or behaviors.  See HPI for recent labs and imaging.  Assessment: 1.  Epigastric pain: Acute increase over the past week, seen in 2 separate ERs with a normal CT and labs, told she likely had gastritis/PUD and restarted on her PPI, history of erosive gastropathy on last EGD in 2017, patient worried about side effects of Alzheimer/dementia 2.  Nausea and vomiting 3.  GERD  Plan: 1.  Patient is very worried about going on high-dose PPI.  Discussed that she may need to do this for a period of time pending on results from EGD. 2.  Increased Omeprazole to 40 mg twice daily, 30-60 minutes before breakfast and dinner #60 with 5 refills. 3.  Continue Famotidine 20 mg twice daily, every morning and nightly #60 with 5 refills 4.  Scheduled patient for diagnostic EGD in the [facility] with  Dr. Ardis Hughs.  Did provide the patient a detailed list of risks for the procedure and she agrees to proceed. Patient is appropriate for endoscopic procedure(s) in the ambulatory ([place]) setting.  5.  Patient to follow in clinic per recommendations from Dr. Ardis Hughs after time of procedure.  Ellouise Newer, PA-C [facility] 11/06/2021, 10:29 AM  Cc: Sherrilee Gilles, DO

## 2021-11-09 NOTE — Progress Notes (Signed)
I agree with the above note, plan 

## 2021-11-13 ENCOUNTER — Encounter: Payer: Self-pay | Admitting: Gastroenterology

## 2021-11-13 ENCOUNTER — Ambulatory Visit (AMBULATORY_SURGERY_CENTER): Payer: Medicare Other | Admitting: Gastroenterology

## 2021-11-13 ENCOUNTER — Other Ambulatory Visit: Payer: Self-pay

## 2021-11-13 VITALS — BP 124/64 | HR 69 | Temp 96.9°F | Resp 15 | Ht 62.0 in | Wt 133.0 lb

## 2021-11-13 DIAGNOSIS — K2970 Gastritis, unspecified, without bleeding: Secondary | ICD-10-CM | POA: Diagnosis not present

## 2021-11-13 DIAGNOSIS — K219 Gastro-esophageal reflux disease without esophagitis: Secondary | ICD-10-CM

## 2021-11-13 DIAGNOSIS — R1013 Epigastric pain: Secondary | ICD-10-CM

## 2021-11-13 DIAGNOSIS — K449 Diaphragmatic hernia without obstruction or gangrene: Secondary | ICD-10-CM | POA: Diagnosis not present

## 2021-11-13 DIAGNOSIS — K3189 Other diseases of stomach and duodenum: Secondary | ICD-10-CM | POA: Diagnosis not present

## 2021-11-13 MED ORDER — SODIUM CHLORIDE 0.9 % IV SOLN: 500.0000 mL | Freq: Once | INTRAVENOUS | Status: DC

## 2021-11-13 MED ORDER — FAMOTIDINE 20 MG PO TABS: 20.0000 mg | ORAL_TABLET | Freq: Every day | ORAL | 4 refills | Status: DC

## 2021-11-13 NOTE — Progress Notes (Signed)
C.W. vital signs. 

## 2021-11-13 NOTE — Progress Notes (Signed)
Report given to PACU, vss 

## 2021-11-13 NOTE — Patient Instructions (Addendum)
Handouts provided on gastritis and hiatal hernia.   For now, continue Omeprazole 40mg  in the morning (shortly before breakfast meal). Stop taking th Omeprazole with dinner. Take Pepcid/famotidine 20mg   pill at beditme every night.   YOU HAD AN ENDOSCOPIC PROCEDURE TODAY AT [facility]:   Refer to the procedure report that was given to you for any specific questions about what was found during the examination.  If the procedure report does not answer your questions, please call your gastroenterologist to clarify.  If you requested that your care partner not be given the details of your procedure findings, then the procedure report has been included in a sealed envelope for you to review at your convenience later.  YOU SHOULD EXPECT: Some feelings of bloating in the abdomen. Passage of more gas than usual.  Walking can help get rid of the air that was put into your GI tract during the procedure and reduce the bloating. If you had a lower endoscopy (such as a colonoscopy or flexible sigmoidoscopy) you may notice spotting of blood in your stool or on the toilet paper. If you underwent a bowel prep for your procedure, you may not have a normal bowel movement for a few days.  Please Note:  You might notice some irritation and congestion in your nose or some drainage.  This is from the oxygen used during your procedure.  There is no need for concern and it should clear up in a day or so.  SYMPTOMS TO REPORT IMMEDIATELY:  Following upper endoscopy (EGD)  Vomiting of blood or coffee ground material  New chest pain or pain under the shoulder blades  Painful or persistently difficult swallowing  New shortness of breath  Fever of 100F or higher  Black, tarry-looking stools  For urgent or emergent issues, a gastroenterologist can be reached at any hour by calling [phone]. Do not use MyChart messaging for urgent concerns.    DIET:  We do recommend a small meal at first, but then  you may proceed to your regular diet.  Drink plenty of fluids but you should avoid alcoholic beverages for 24 hours.  ACTIVITY:  You should plan to take it easy for the rest of today and you should NOT DRIVE or use heavy machinery until tomorrow (because of the sedation medicines used during the test).    FOLLOW UP: Our staff will call the number listed on your records 48-72 hours following your procedure to check on you and address any questions or concerns that you may have regarding the information given to you following your procedure. If we do not reach you, we will leave a message.  We will attempt to reach you two times.  During this call, we will ask if you have developed any symptoms of COVID 19. If you develop any symptoms (ie: fever, flu-like symptoms, shortness of breath, cough etc.) before then, please call [phone].  If you test positive for Covid 19 in the 2 weeks post procedure, please call and report this information to [place].    If any biopsies were taken you will be contacted by phone or by letter within the next 1-3 weeks.  Please call us at [phone] if you have not heard about the biopsies in 3 weeks.    SIGNATURES/CONFIDENTIALITY: You and/or your care partner have signed paperwork which will be entered into your electronic medical record.  These signatures attest to the fact that that the information above on your After Visit  Summary has been reviewed and is understood.  Full responsibility of the confidentiality of this discharge information lies with you and/or your care-partner.

## 2021-11-13 NOTE — Progress Notes (Signed)
Called to room to assist during endoscopic procedure.  Patient ID and intended procedure confirmed with present staff. Received instructions for my participation in the procedure from the performing physician.  

## 2021-11-13 NOTE — Progress Notes (Signed)
°  The recent H&P (dated 11/06/2021) was reviewed, the patient was examined and there is no change in the patients condition since that H&P was completed.   Shelby Andersen  11/13/2021, 9:32 AM

## 2021-11-13 NOTE — Op Note (Signed)
[facility] Patient Name: Shelby Andersen Procedure Date: 11/13/2021 9:27 AM MRN: [id number] Endoscopist: Milus Banister , MD Age: 71 Referring MD:  Date of Birth: 1951/06/15 Gender: Female Account #: [id number] Procedure:                Upper GI endoscopy Indications:              Epigastric abdominal pain Medicines:                Monitored Anesthesia Care Procedure:                Pre-Anesthesia Assessment:                           - Prior to the procedure, a History and Physical                            was performed, and patient medications and                            allergies were reviewed. The patient's tolerance of                            previous anesthesia was also reviewed. The risks                            and benefits of the procedure and the sedation                            options and risks were discussed with the patient.                            All questions were answered, and informed consent                            was obtained. Prior Anticoagulants: The patient has                            taken no previous anticoagulant or antiplatelet                            agents. ASA Grade Assessment: II - A patient with                            mild systemic disease. After reviewing the risks                            and benefits, the patient was deemed in                            satisfactory condition to undergo the procedure.                           After obtaining informed consent, the endoscope was  passed under direct vision. Throughout the                            procedure, the patient's blood pressure, pulse, and                            oxygen saturations were monitored continuously. The                            GIF [id number] #[id number] was introduced through the                            mouth, and advanced to the second part of duodenum.                            The upper GI endoscopy was  accomplished without                            difficulty. The patient tolerated the procedure                            well. Scope In: Scope Out: Findings:                 Mild inflammation characterized by friability and                            granularity was found in the gastric antrum.                            Biopsies were taken with a cold forceps for                            histology.                           A small hiatal hernia was present. Complications:            No immediate complications. Estimated blood loss:                            None. Estimated Blood Loss:     None Impression:               - Small hiatal hernia.                           - Mild, non-specific gastritis. Biopsied to check                            for H. pylori. Recommendation:           - Await pathology results.                           - For now, continue omeprazole 40mg  in morning                            (  shortly before breakfast meal). Stop taking the                            omeprazole with dinner. Pepcid/famotidine 20mg  pill                            at bedtime every night. Milus Banister, MD 11/13/2021 9:53:53 AM This report has been signed electronically.

## 2021-11-13 NOTE — Progress Notes (Signed)
9198 Robinul 0.1 mg IV given due large amount of secretions upon assessment.  MD made aware, vss

## 2021-11-13 NOTE — Progress Notes (Signed)
Pt's states no medical or surgical changes since previsit or office visit. 

## 2021-11-17 ENCOUNTER — Telehealth: Payer: Self-pay | Admitting: *Deleted

## 2021-11-17 NOTE — Telephone Encounter (Signed)
°  Follow up Call-  Call back number 11/13/2021 03/19/2020  Post procedure Call Back phone  # [phone]  Permission to leave phone message Yes Yes  Some recent data might be hidden     Patient questions:  Do you have a fever, pain , or abdominal swelling? No. Pain Score  0 *  Have you tolerated food without any problems? Yes.    Have you been able to return to your normal activities? Yes.    Do you have any questions about your discharge instructions: Diet   No. Medications  No. Follow up visit  No.  Do you have questions or concerns about your Care? No.  Actions: * If pain score is 4 or above: No action needed, pain <4.

## 2021-11-18 ENCOUNTER — Encounter: Payer: Self-pay | Admitting: Gastroenterology

## 2022-01-26 ENCOUNTER — Ambulatory Visit: Payer: Medicare Other | Admitting: Gastroenterology

## 2022-02-01 ENCOUNTER — Ambulatory Visit: Payer: Medicare Other | Admitting: Neurology

## 2022-03-24 ENCOUNTER — Encounter: Payer: Self-pay | Admitting: Gastroenterology

## 2022-03-24 ENCOUNTER — Ambulatory Visit (INDEPENDENT_AMBULATORY_CARE_PROVIDER_SITE_OTHER): Payer: Medicare Other | Admitting: Gastroenterology

## 2022-03-24 VITALS — BP 138/84 | HR 70 | Ht 62.0 in | Wt 134.1 lb

## 2022-03-24 DIAGNOSIS — R1013 Epigastric pain: Secondary | ICD-10-CM | POA: Diagnosis not present

## 2022-03-24 NOTE — Progress Notes (Signed)
Review of pertinent gastrointestinal problems: 1.  Collagenous colitis.  Chronic loose stools led to colonoscopy June 2021.  Terminal ileum was normal.  Diverticulosis left colon.  Random colon biopsies from normal-appearing colon mucosa improved "collagenous colitis" 2.  Epigastric abdominal pain led to eventual EGD 10/2021 that showed small hiatal hernia and mild gastritis.  H. pylori negative on pathology.  I recommended that she continue omeprazole 40 mg every morning, stop the omeprazole at bedtime and start famotidine at bedtime every night.  Work-up at [facility] 10/2021 prior to the EGD included CT scan abdomen pelvis, right upper quadrant ultrasound, blood work including CBC and complete metabolic profile these were all essentially unrevealing.   HPI: This is a very pleasant 71 year old woman whom I last saw the time of upper endoscopy about 5 months ago.  See that results summarized above  She has been quite diligent about taking her omeprazole 40 mg 1 pill every morning shortly before breakfast and a Pepcid 20 mg 1 pill at bedtime every night.  She says on this regimen she feels mostly fine.  She has been bothered a few times by epigastric pain that radiates to her back but those are less common now.  ROS: complete GI ROS as described in HPI, all other review negative.  Constitutional:  No unintentional weight loss   Past Medical History:  Diagnosis Date   Anemia    Blood transfusion without reported diagnosis    Cancer ([place])    nose basal cell, leg basal cell   Cataract    minimal   Colitis 03/19/2020   Depression    Diverticulosis    Dysphagia    Elevated cholesterol    Fibromyalgia    GERD (gastroesophageal reflux disease)    IBS (irritable bowel syndrome)    Multiple gastric ulcers    Osteoarthritis    PONV (postoperative nausea and vomiting)     Past Surgical History:  Procedure Laterality Date   BLEPHAROPLASTY Bilateral    2023   CATARACT EXTRACTION  2022    COLONOSCOPY  2010   [place] SURGERY  2022   face   DILATION AND CURETTAGE OF UTERUS     ESOPHAGEAL DILATION N/A 04/21/2016   Procedure: ESOPHAGEAL DILATION;  Surgeon: Rogene Houston, MD;  [facility];  Service: Endoscopy;  Laterality: N/A;   ESOPHAGOGASTRODUODENOSCOPY N/A 04/21/2016   Procedure: ESOPHAGOGASTRODUODENOSCOPY (EGD);  Surgeon: Rogene Houston, MD;  [facility];  Service: Endoscopy;  Laterality: N/A;  2:25   E[place] SURGERY Left 2022   [place]   FOOT SURGERY      Current Outpatient Medications  Medication Instructions   acetaminophen (TYLENOL) 1,300 mg, Oral, Every 8 hours PRN   Coenzyme Q10 (COQ10) 200 MG CAPS Oral, Daily   estradiol (ESTRACE) 1 g, Vaginal, 2 times weekly   famotidine (PEPCID) 20 mg, Oral, Daily at bedtime   MAGNESIUM CITRATE PO Oral   mirabegron ER (MYRBETRIQ) 50 MG TB24 tablet Myrbetriq 50 mg tablet,extended release  TAKE 1 TABLET BY MOUTH DAILY   Multiple Vitamins-Minerals (MULTIVITAMINS THER. W/MINERALS) TABS tablet 1 tablet, Oral, Daily   NON FORMULARY Magnilife Leg and Back pain relief PRN as needed    nortriptyline (PAMELOR) 10 MG capsule Oral   omeprazole (PRILOSEC) 40 mg, Oral, 2 times daily before meals   pravastatin (PRAVACHOL) 40 mg, Oral,  Once, Take one tab once daily   PREMARIN vaginal cream Vaginal, 2 times weekly   traZODone (DESYREL) 50 mg,  Oral, Daily at bedtime    Allergies as of 03/24/2022 - Review Complete 03/24/2022  Allergen Reaction Noted   Codeine  02/27/2016    Family History  Problem Relation Age of Onset   Heart disease Mother    Heart disease Father    Skin cancer Brother    Heart disease Brother    Colon cancer Maternal Uncle    Diabetes Maternal Uncle    Diabetes Maternal Aunt    Diabetes Maternal Grandmother    Esophageal cancer Neg Hx    Stomach cancer Neg Hx    Rectal cancer Neg Hx     Social History   Socioeconomic History   Marital status: Married    Spouse name:  Not on file   Number of children: 1   Years of education: Not on file   Highest education level: Not on file  Occupational History   Occupation: retired  Tobacco Use   Smoking status: Never   Smokeless tobacco: Never  Vaping Use   Vaping Use: Never used  Substance and Sexual Activity   Alcohol use: Yes    Alcohol/week: 0.0 standard drinks of alcohol    Comment: rare   Drug use: No   Sexual activity: Not Currently    Partners: Male  Other Topics Concern   Not on file  Social History Narrative   Right handed      Lives with husband in two story home. Stays on first floor      2 years of college edu      Drinks Caffeine. Coffee and Tea   Social Determinants of Radio broadcast assistant Strain: Not on file  Food Insecurity: Not on file  Transportation Needs: Not on file  Physical Activity: Not on file  Stress: Not on file  Social Connections: Not on file  Intimate Partner Violence: Not on file     Physical Exam: BP 138/84   Pulse 70   Ht '5\' 2"'$  (1.575 m)   Wt 134 lb 2 oz (60.8 kg)   SpO2 99%   BMI 24.53 kg/m  Constitutional: generally well-appearing Psychiatric: alert and oriented x3 Abdomen: soft, nontender, nondistended, no obvious ascites, no peritoneal signs, normal bowel sounds No peripheral edema noted in lower extremities  Assessment and plan: 71 y.o. female with intermittent epigastric pains  We discussed that biliary dyskinesia might be playing a role here and I offered HIDA scan to help test for that.  She is not interested because she mostly feels better and she just does not have time for a lot of testing or medical issues right now.  She knows that if the pains get worse or if she has any other further questions or concerns or she simply repeat considers having the HIDA scan to check for biliary dyskinesia she can call.  Please see the "Patient Instructions" section for addition details about the plan.  Owens Loffler, MD [facility] 03/24/2022, 2:08 PM   Total time on date of encounter was 20 minutes (this included time spent preparing to see the patient reviewing records; obtaining and/or reviewing separately obtained history; performing a medically appropriate exam and/or evaluation; counseling and educating the patient and family if present; ordering medications, tests or procedures if applicable; and documenting clinical information in the health record).

## 2022-03-24 NOTE — Patient Instructions (Signed)
If you are age 74 or older, your body mass index should be between 23-30. Your Body mass index is 24.53 kg/m. If this is out of the aforementioned range listed, please consider follow up with your Primary Care Provider. ________________________________________________________  The [facility] providers would like to encourage you to use [facility] to communicate with providers for non-urgent requests or questions.  Due to long hold times on the telephone, sending your provider a message by [facility] may be a faster and more efficient way to get a response.  Please allow 48 business hours for a response.  Please remember that this is for non-urgent requests.  _______________________________________________________  Shelby Andersen will follow up in our office on an as needed basis.  Thank you for entrusting me with your care and choosing [facility].  Dr Ardis Hughs

## 2022-09-02 ENCOUNTER — Other Ambulatory Visit: Payer: Self-pay | Admitting: *Deleted

## 2022-09-02 MED ORDER — OMEPRAZOLE 40 MG PO CPDR: 40.0000 mg | DELAYED_RELEASE_CAPSULE | Freq: Two times a day (BID) | ORAL | 5 refills | Status: DC

## 2023-01-06 ENCOUNTER — Telehealth: Payer: Self-pay | Admitting: Gastroenterology

## 2023-01-06 MED ORDER — FAMOTIDINE 20 MG PO TABS: 20.0000 mg | ORAL_TABLET | Freq: Every day | ORAL | 3 refills | Status: DC

## 2023-01-06 NOTE — Telephone Encounter (Signed)
Rx for famotidine sent to pharmacy as requested per Dr Tomasa Rand.

## 2023-01-06 NOTE — Telephone Encounter (Signed)
Inbound call from patient , is requesting refill for Famotadine sent to [facility] on file.

## 2023-01-06 NOTE — Telephone Encounter (Signed)
This is a patient of Dr Christella Hartigan.  Please advise if OK to refill medication as you are DOD pm.  Thank you, Joni Reining

## 2023-11-18 ENCOUNTER — Encounter (INDEPENDENT_AMBULATORY_CARE_PROVIDER_SITE_OTHER): Payer: Self-pay

## 2023-11-20 NOTE — Progress Notes (Deleted)
 Referring Provider:   O'Corragain, Donzetta Sprung, MD Primary Care Physician:  O'Corragain, Oisin Mervyn Skeeters, MD Primary Gastroenterologist:  Dr. Bonnetta Barry chief complaint on file.   HPI:   Shelby Andersen is a 73 y.o. female presenting today at the request of   O'Corragain, Oisin A, MD for GERD.    EGD 11/13/2021 with Dr. Christella Hartigan: Small hiatal hernia, mild, nonspecific gastritis biopsied.  Pathology was negative for H. pylori.  Last colonoscopy 03/19/2020 with Dr. Christella Hartigan: Normal TI, left colon diverticulosis, otherwise normal exam.  Biopsies taken of the colon to evaluate for microscopic colitis.  Stated patient could continue to use single Imodium once daily as it was helping.  Pathology showed collagenous colitis.  Past Medical History:  Diagnosis Date   Anemia    Blood transfusion without reported diagnosis    Cancer ([facility])    nose basal cell, leg basal cell   Cataract    minimal   Colitis 03/19/2020   Depression    Diverticulosis    Dysphagia    Elevated cholesterol    Fibromyalgia    GERD (gastroesophageal reflux disease)    IBS (irritable bowel syndrome)    Multiple gastric ulcers    Osteoarthritis    PONV (postoperative nausea and vomiting)     Past Surgical History:  Procedure Laterality Date   BLEPHAROPLASTY Bilateral    2023   CATARACT EXTRACTION  2022   COLONOSCOPY  2010   [place]   COSMETIC SURGERY  2022   face   DILATION AND CURETTAGE OF UTERUS     ESOPHAGEAL DILATION N/A 04/21/2016   Procedure: ESOPHAGEAL DILATION;  Surgeon: Malissa Hippo, MD;  [facility];  Service: Endoscopy;  Laterality: N/A;   ESOPHAGOGASTRODUODENOSCOPY N/A 04/21/2016   Procedure: ESOPHAGOGASTRODUODENOSCOPY (EGD);  Surgeon: Malissa Hippo, MD;  [facility];  Service: Endoscopy;  Laterality: N/A;  2:25   E[place] SURGERY Left 2022   [place]   FOOT SURGERY      Current Outpatient Medications  Medication Sig Dispense Refill   acetaminophen (TYLENOL) 650 MG CR tablet Take  1,300 mg by mouth every 8 (eight) hours as needed for pain.     Coenzyme Q10 (COQ10) 200 MG CAPS Take by mouth daily.     estradiol (ESTRACE) 0.1 MG/GM vaginal cream Place 1 g vaginally 2 (two) times a week.     famotidine (PEPCID) 20 MG tablet Take 1 tablet (20 mg total) by mouth at bedtime. 90 tablet 3   MAGNESIUM CITRATE PO Take by mouth.     mirabegron ER (MYRBETRIQ) 50 MG TB24 tablet Myrbetriq 50 mg tablet,extended release  TAKE 1 TABLET BY MOUTH DAILY     Multiple Vitamins-Minerals (MULTIVITAMINS THER. W/MINERALS) TABS tablet Take 1 tablet by mouth daily.     NON FORMULARY Magnilife Leg and Back pain relief PRN as needed     nortriptyline (PAMELOR) 10 MG capsule Take by mouth.     omeprazole (PRILOSEC) 40 MG capsule Take 1 capsule (40 mg total) by mouth 2 (two) times daily before a meal. 60 capsule 5   pravastatin (PRAVACHOL) 40 MG tablet Take 40 mg by mouth once. Take one tab once daily     PREMARIN vaginal cream Place vaginally 2 (two) times a week.     traZODone (DESYREL) 50 MG tablet Take 50 mg by mouth at bedtime.     No current facility-administered medications for this visit.    Allergies as of 11/23/2023 - Review Complete 03/24/2022  Allergen Reaction Noted   Codeine  02/27/2016    Family History  Problem Relation Age of Onset   Heart disease Mother    Heart disease Father    Skin cancer Brother    Heart disease Brother    Colon cancer Maternal Uncle    Diabetes Maternal Uncle    Diabetes Maternal Aunt    Diabetes Maternal Grandmother    Esophageal cancer Neg Hx    Stomach cancer Neg Hx    Rectal cancer Neg Hx     Social History   Socioeconomic History   Marital status: Married    Spouse name: Not on file   Number of children: 1   Years of education: Not on file   Highest education level: Not on file  Occupational History   Occupation: retired  Tobacco Use   Smoking status: Never   Smokeless tobacco: Never  Vaping Use   Vaping status: Never Used   Substance and Sexual Activity   Alcohol use: Yes    Alcohol/week: 0.0 standard drinks of alcohol    Comment: rare   Drug use: No   Sexual activity: Not Currently    Partners: Male  Other Topics Concern   Not on file  Social History Narrative   Right handed      Lives with husband in two story home. Stays on first floor      2 years of college edu      Drinks Caffeine. Coffee and Tea   Social Drivers of Corporate investment banker Strain: Not on file  Food Insecurity: Not on file  Transportation Needs: Not on file  Physical Activity: Not on file  Stress: Not on file  Social Connections: Not on file  Intimate Partner Violence: Not on file    Review of Systems: Gen: Denies any fever, chills, fatigue, weight loss, lack of appetite.  CV: Denies chest pain, heart palpitations, peripheral edema, syncope.  Resp: Denies shortness of breath at rest or with exertion. Denies wheezing or cough.  GI: Denies dysphagia or odynophagia. Denies jaundice, hematemesis, fecal incontinence. GU : Denies urinary burning, urinary frequency, urinary hesitancy MS: Denies joint pain, muscle weakness, cramps, or limitation of movement.  Derm: Denies rash, itching, dry skin Psych: Denies depression, anxiety, memory loss, and confusion Heme: Denies bruising, bleeding, and enlarged lymph nodes.  Physical Exam: There were no vitals taken for this visit. General:   Alert and oriented. Pleasant and cooperative. Well-nourished and well-developed.  Head:  Normocephalic and atraumatic. Eyes:  Without icterus, sclera clear and conjunctiva pink.  Ears:  Normal auditory acuity. Lungs:  Clear to auscultation bilaterally. No wheezes, rales, or rhonchi. No distress.  Heart:  S1, S2 present without murmurs appreciated.  Abdomen:  +BS, soft, non-tender and non-distended. No HSM noted. No guarding or rebound. No masses appreciated.  Rectal:  Deferred  Msk:  Symmetrical without gross deformities. Normal  posture. Extremities:  Without edema. Neurologic:  Alert and  oriented x4;  grossly normal neurologically. Skin:  Intact without significant lesions or rashes. Psych:  Alert and cooperative. Normal mood and affect.    Assessment:     Plan:  ***   Ermalinda Memos, PA-C [facility] 11/23/2023

## 2023-11-21 ENCOUNTER — Ambulatory Visit: Payer: Medicare Other | Admitting: Gastroenterology

## 2023-11-23 ENCOUNTER — Ambulatory Visit: Payer: Medicare Other | Admitting: Gastroenterology

## 2023-12-12 ENCOUNTER — Ambulatory Visit: Admitting: Gastroenterology

## 2023-12-27 NOTE — Progress Notes (Addendum)
 Referring Provider:O'Corragain, Oisin A, MD Primary Care Physician:  O'Corragain, Oisin Alana Hoyle, MD Primary Gastroenterologist:  Dr. Riley Cheadle  Chief Complaint  Patient presents with   Gastroesophageal Reflux    Has a cough thinks it maybe acid reflux     HPI:   Shelby Andersen is a 73 y.o. female presenting today at the request of  O'Corragain, Oisin A, MD for GERD.   Today: Cough for over a year now. Had x ray of sinuses and was told they were clear. Wondering if it is reflux related. In the last week, she has had severe pain across the upper abdomen radiating to her back and shoulder. Reports symptoms are better. States she went to an urgent care in [place]. They did blood work to check Lfts etc and was told everything came back normal. She was prescribed a medication that coats her stomach, but wasn't able to pick it up due to Rx for 1/2 bottle and states pharmacy couldn't open the bottle.    Pain was constant. Didn't eat much due to pain. No nausea or vomiting. Can't say pain would worse with meals. Was eating very bland. Stopped coffee. Clinically feeling much better at this point.   Having GERD with reflux into her mouth at times. GERD to some degree  probably at least a few times a week. Belches a lot. Takes famotidine  at bedtime. Took herself off omeprazole  because it was affecting her mind. Prefers to avoid PPIs if possible.   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  She is under a lot of stress with this.   Had diarrhea last week for 1 day. 2 days ago, she also had some diarrhea. Took imodium  and hasn't had any recurrence.  Prior to this, stools were normal.  No recent antibiotics.   Migraines for the last couple of years.Has taken a BC powder on rare occasion.  No melena. For the last year or so had had a small amount of red blood on toilet tissue when she wipes after a bowel movement. No rectal pain. Knows she has an external hemorrhoid.  Has  not used anything for the hemorrhoid.    EGD 11/13/2021 with Dr. Howard Macho: Small hiatal hernia, mild, nonspecific gastritis biopsied.  Pathology was negative for H. pylori.  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   Last colonoscopy 03/19/2020 with Dr. Howard Macho: Normal TI, left colon diverticulosis, otherwise normal exam.  Biopsies taken of the colon to evaluate for microscopic colitis.  Stated patient could continue to use single Imodium  once daily as it was helping.  Pathology showed collagenous colitis.   Past Medical History:  Diagnosis Date   Anemia    Blood transfusion without reported diagnosis    Cancer (HCC)    nose basal cell, leg basal cell   Cataract    minimal   Colitis 03/19/2020   Depression    Diverticulosis    Dysphagia    Elevated cholesterol    Fibromyalgia    GERD (gastroesophageal reflux disease)    IBS (irritable bowel syndrome)    Multiple gastric ulcers    Osteoarthritis    PONV (postoperative nausea and vomiting)     Past Surgical History:  Procedure Laterality Date   BLEPHAROPLASTY Bilateral    2023   CATARACT EXTRACTION  2022   COLONOSCOPY  2010   [place]   COSMETIC SURGERY  2022   face   DILATION AND CURETTAGE  OF UTERUS     ESOPHAGEAL DILATION N/A 04/21/2016   Procedure: ESOPHAGEAL DILATION;  Surgeon: Ruby Corporal, MD;  [facility];  Service: Endoscopy;  Laterality: N/A;   ESOPHAGOGASTRODUODENOSCOPY N/A 04/21/2016   Procedure: ESOPHAGOGASTRODUODENOSCOPY (EGD);  Surgeon: Ruby Corporal, MD;  [facility];  Service: Endoscopy;  Laterality: N/A;  2:25   EYE SURGERY Left 2022   [place]   FOOT SURGERY      Current Outpatient Medications  Medication Sig Dispense Refill   acetaminophen (TYLENOL) 650 MG CR tablet Take 1,300 mg by mouth every 8 (eight) hours as needed for pain.     hydrocortisone  (ANUSOL -HC) 2.5 % rectal cream Place 1 Application rectally 2 (two) times daily. 30 g 1    mirabegron ER (MYRBETRIQ) 50 MG TB24 tablet      NON FORMULARY Magnilife Leg and Back pain relief PRN as needed     pravastatin (PRAVACHOL) 40 MG tablet Take 40 mg by mouth once. Take one tab once daily     sertraline  (ZOLOFT ) 100 MG tablet Take 1 tablet by mouth daily.     sucralfate  (CARAFATE ) 1 g tablet Take 1 tablet (1 g total) by mouth in the morning, at noon, and at bedtime. Dissolve tablet in 15-30 ml of water  and drink slurry. 90 tablet 0   traZODone (DESYREL) 50 MG tablet Take 50 mg by mouth at bedtime.     zonisamide (ZONEGRAN) 50 MG capsule Take 100 mg by mouth at bedtime.     famotidine  (PEPCID ) 20 MG tablet Take 1 tablet (20 mg total) by mouth 2 (two) times daily. 60 tablet 3   No current facility-administered medications for this visit.    Allergies as of 12/29/2023 - Review Complete 12/29/2023  Allergen Reaction Noted   Codeine  02/27/2016    Family History  Problem Relation Age of Onset   Heart disease Mother    Heart disease Father    Skin cancer Brother    Heart disease Brother    Colon cancer Maternal Uncle    Diabetes Maternal Uncle    Diabetes Maternal Aunt    Diabetes Maternal Grandmother    Esophageal cancer Neg Hx    Stomach cancer Neg Hx    Rectal cancer Neg Hx     Social History   Socioeconomic History   Marital status: Married    Spouse name: Not on file   Number of children: 1   Years of education: Not on file   Highest education level: Not on file  Occupational History   Occupation: retired  Tobacco Use   Smoking status: Never   Smokeless tobacco: Never  Vaping Use   Vaping status: Never Used  Substance and Sexual Activity   Alcohol use: Yes    Alcohol/week: 0.0 standard drinks of alcohol    Comment: rare   Drug use: No   Sexual activity: Not Currently    Partners: Male  Other Topics Concern   Not on file  Social History Narrative   Right handed      Lives with husband in two story home. 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 Coffee and Tea   Social Drivers of Corporate investment banker Strain: Not on file  Food Insecurity: Not on file  Transportation Needs: Not on file  Physical Activity: Not on file  Stress: Not on file  Social Connections: Not  on file  Intimate Partner Violence: Not on file    Review of Systems: Gen: Denies any fever, chills, cold or flulike symptoms, presyncope, syncope. CV: Denies chest pain, heart palpitations. Resp: Denies shortness of breath.  GI: See HPI GU : Denies urinary burning, urinary frequency, urinary hesitancy MS: Denies joint pain. Derm: Denies rash. Psych: Denies depression, anxiety. Heme: See HPI  Physical Exam: BP 136/80 (BP Location: Right Arm, Patient Position: Sitting, Cuff Size: Normal)   Pulse 75   Temp (!) 96.9 F (36.1 C) (Temporal)   Ht 5\' 2"  (1.575 m)   Wt 130 lb 9.6 oz (59.2 kg)   BMI 23.89 kg/m  General:   Alert and oriented. Pleasant and cooperative. Well-nourished and well-developed.  Head:  Normocephalic and atraumatic. Eyes:  Without icterus, sclera clear and conjunctiva pink.  Ears:  Normal auditory acuity. Lungs:  Clear to auscultation bilaterally. No wheezes, rales, or rhonchi. No distress.  Heart:  S1, S2 present without murmurs appreciated.  Abdomen:  +BS, soft, and non-distended.  Mild TTP in epigastric area.  No HSM noted. No guarding or rebound. No masses appreciated.  Rectal: External hemorrhoid in the 11 o'clock position.  Nontender, but with slight excoriation.  Msk:  Symmetrical without gross deformities. Normal posture. Extremities:  Without edema. Neurologic:  Alert and  oriented x4;  grossly normal neurologically. Skin:  Intact without significant lesions or rashes. Psych:  Normal mood and affect.    Assessment:    Chronic cough/GERD: Chronic cough likely secondary to uncontrolled GERD noting breakthrough symptoms at least a few times a week.  Currently on famotidine  20 mg at  bedtime.  Previously on omeprazole , but discontinued as it was "affecting her mind".  Prefers to avoid PPIs.  Will increase famotidine  for now.  Epigastric abdominal pain: New onset upper abdominal pain radiating to her back and shoulder last week.   Patient reports being seen at an urgent care and [facility] last week and was told liver enzymes, pancreas, etc. was normal.  I have no access to these records.  She continues with mild tenderness in the epigastric area, but much improved overall.  Denies having any nausea, vomiting, or pain worsened postprandially.  She did not make any medication changes though she did start eating a bland diet and limiting the coffee.  Etiology unclear.  It is possible she had acute viral illness that she also had diarrhea.  She does have a history of erosive gastropathy in 2017 in the setting of meloxicam, but denies any NSAID use at this point aside from very rare BC powder.  Could have gastritis, duodenitis, PUD.  Unable to rule out biliary etiology.  Gallbladder in situ.  We discussed the possibility of adjusting reflux medications as she also has uncontrolled GERD as per above,  EGD, and abdominal imaging.  Patient prefers to start with medication adjustments first.  Due to concerns about PPIs affecting her mental status, we will increase famotidine  for now.  Rectal bleeding/external hemorrhoid: Intermittent toilet tissue hematochezia in the setting of external hemorrhoid noted on exam today.  Colonoscopy is up-to-date as of June 2021.  No polyps at that time.  I do not suspect any other significant intracolonic etiology.  Will treat hemorrhoids at this time with Anusol .  Diarrhea: Diarrhea lasting for less than 24 hours 1 day last week and also a single episode of diarrhea 2 days ago.  No alarm symptoms.  No recent antibiotics.  May have had an acute viral illness that  she also had new onset upper abdominal pain.  I do note she has history of collagenous colitis in  2021 treated with Imodium  and ultimately symptoms resolving, no longer requiring Imodium  and having normal bowel movements until these recent episodes of diarrhea.  For now, we will continue to monitor for now as symptoms have not been consistent.   Plan:  Increase famotidine  to 20 mg twice a day. Carafate  1 g 3 times daily before meals.  Advised to dissolve tablet in 15 mL of water , then drink the slurry. Follow a GERD diet:  Avoid fried, fatty, greasy, spicy, citrus foods. Avoid caffeine and carbonated beverages. Avoid chocolate. Try eating 4-6 small meals a day rather than 3 large meals. Do not eat within 3 hours of laying down. Prop head of bed up on wood or bricks to create a 6 inch incline. Anusol  rectal cream twice daily x 5 to 7 days as needed. Attempt to obtain recent labs from [facility].  Follow-up in 6 weeks.    Shana Daring, PA-C [facility] 12/29/2023   Addendum:  Received outside labs dated 12/26/2023: No significant abnormalities on lipase or CMP (scanned in under media).  Shana Daring, PA-C [facility] 01/12/24

## 2023-12-29 ENCOUNTER — Ambulatory Visit: Admitting: Gastroenterology

## 2023-12-29 ENCOUNTER — Encounter: Payer: Self-pay | Admitting: Gastroenterology

## 2023-12-29 VITALS — BP 136/80 | HR 75 | Temp 96.9°F | Ht 62.0 in | Wt 130.6 lb

## 2023-12-29 DIAGNOSIS — K644 Residual hemorrhoidal skin tags: Secondary | ICD-10-CM

## 2023-12-29 DIAGNOSIS — R053 Chronic cough: Secondary | ICD-10-CM | POA: Diagnosis not present

## 2023-12-29 DIAGNOSIS — K625 Hemorrhage of anus and rectum: Secondary | ICD-10-CM

## 2023-12-29 DIAGNOSIS — R197 Diarrhea, unspecified: Secondary | ICD-10-CM

## 2023-12-29 DIAGNOSIS — R1013 Epigastric pain: Secondary | ICD-10-CM

## 2023-12-29 DIAGNOSIS — K219 Gastro-esophageal reflux disease without esophagitis: Secondary | ICD-10-CM | POA: Diagnosis not present

## 2023-12-29 MED ORDER — FAMOTIDINE 20 MG PO TABS: 20.0000 mg | ORAL_TABLET | Freq: Two times a day (BID) | ORAL | 3 refills | Status: AC

## 2023-12-29 MED ORDER — HYDROCORTISONE (PERIANAL) 2.5 % EX CREA: 1.0000 | TOPICAL_CREAM | Freq: Two times a day (BID) | CUTANEOUS | 1 refills | Status: AC

## 2023-12-29 MED ORDER — SUCRALFATE 1 G PO TABS: 1.0000 g | ORAL_TABLET | Freq: Three times a day (TID) | ORAL | 0 refills | Status: AC

## 2023-12-29 NOTE — Patient Instructions (Addendum)
 Famotidine 20 mg twice daily.   Carafate 1g 3 times daily before meals. Dissolve the tablet in 15 ml of water and drink the slurry.   Follow a GERD diet:  Avoid fried, fatty, greasy, spicy, citrus foods. Avoid caffeine and carbonated beverages. Avoid chocolate. Try eating 4-6 small meals a day rather than 3 large meals. Do not eat within 3 hours of laying down. Prop head of bed up on wood or bricks to create a 6 inch incline.  Use anusol rectal cream twice daily for 5-7 days at a time for external hemorrhoid and rectal bleeding.   I will plan to see you back in 6 weeks.  It was nice to meet you today!   Ermalinda Memos, PA-C [facility]

## 2023-12-30 ENCOUNTER — Encounter: Payer: Self-pay | Admitting: Gastroenterology

## 2024-01-22 ENCOUNTER — Telehealth: Payer: Self-pay | Admitting: Gastroenterology

## 2024-01-22 NOTE — Telephone Encounter (Signed)
 Received and reviewed labs dated 12/26/23 (under media). LFTs, lipase, Hgb, wbc count wnl.   No additional recommendations at this time.

## 2024-02-16 NOTE — Progress Notes (Unsigned)
 Referring Provider: O'Corragain, Oisin A, MD Primary Care Physician:  O'Corragain, Oisin Alana Hoyle, MD Primary GI Physician: Dr. Riley Cheadle  No chief complaint on file.   HPI:   Shelby Andersen is a 73 y.o. female presenting today for follow-up of chronic cough/GERD, epigastric pain, rectal bleeding/external hemorrhoid, and diarrhea.   Last seen in the office 12/29/23 for the same. Reported chronic cough, breakthrough GERD a few times a week on famotidine  and preferred to avoid PPIs due to omeprazole  "affecting her mind". New onset epigastric pain radiating to her back and shoulder blade week prior which improved quite a bit. Denies postprandial symptoms, nausea, or vomiting. Had acute diarrhea for about 24 hours with abdominal pain first started. Queried self limiting viral illness, but unable to rule out PUD. Discussed EGD, imaging, and medication management, and patient preferred to try adjusting medications first. Recommended increasing famotidine  to 20 mg BID, carafate  TID, GERD diet. Also requested recent labs from [facility].   She also had intermittent tissue hematochezia in the setting of external hemorrhoid noted on exam She was started on Anusol  rectal cream.    Received and reviewed labs dated 12/26/23 (under media). LFTs, lipase, Hgb, wbc count wnl.    Today:  Chronic cough/GERD   Epigastric pain:   Rectal bleeding/external hemorrhoid   Diarrhea:      EGD 11/13/2021 with Dr. Howard Macho: Small hiatal hernia, mild, nonspecific gastritis biopsied.  Pathology was negative for H. pylori.   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   Last colonoscopy 03/19/2020 with Dr. Howard Macho: Normal TI, left colon diverticulosis, otherwise normal exam.  Biopsies taken of the colon to evaluate for microscopic colitis.  Stated patient could continue to use single Imodium  once daily as it was helping.  Pathology showed collagenous colitis.  Past Medical History:   Diagnosis Date   Anemia    Blood transfusion without reported diagnosis    Cancer (HCC)    nose basal cell, leg basal cell   Cataract    minimal   Colitis 03/19/2020   Depression    Diverticulosis    Dysphagia    Elevated cholesterol    Fibromyalgia    GERD (gastroesophageal reflux disease)    IBS (irritable bowel syndrome)    Multiple gastric ulcers    Osteoarthritis    PONV (postoperative nausea and vomiting)     Past Surgical History:  Procedure Laterality Date   BLEPHAROPLASTY Bilateral    2023   CATARACT EXTRACTION  2022   COLONOSCOPY  2010   [place]   COSMETIC SURGERY  2022   face   DILATION AND CURETTAGE OF UTERUS     ESOPHAGEAL DILATION N/A 04/21/2016   Procedure: ESOPHAGEAL DILATION;  Surgeon: Ruby Corporal, MD;  [facility];  Service: Endoscopy;  Laterality: N/A;   ESOPHAGOGASTRODUODENOSCOPY N/A 04/21/2016   Procedure: ESOPHAGOGASTRODUODENOSCOPY (EGD);  Surgeon: Ruby Corporal, MD;  [facility];  Service: Endoscopy;  Laterality: N/A;  2:25   EYE SURGERY Left 2022   [place]   FOOT SURGERY      Current Outpatient Medications  Medication Sig Dispense Refill   acetaminophen (TYLENOL) 650 MG CR tablet Take 1,300 mg by mouth every 8 (eight) hours as needed for pain.     famotidine  (PEPCID ) 20 MG tablet Take 1 tablet (20 mg total) by mouth 2 (two) times daily. 60 tablet 3   hydrocortisone  (ANUSOL -HC) 2.5 % rectal cream Place 1 Application rectally 2 (two)  times daily. 30 g 1   mirabegron ER (MYRBETRIQ) 50 MG TB24 tablet      NON FORMULARY Magnilife Leg and Back pain relief PRN as needed     pravastatin (PRAVACHOL) 40 MG tablet Take 40 mg by mouth once. Take one tab once daily     sertraline  (ZOLOFT ) 100 MG tablet Take 1 tablet by mouth daily.     sucralfate  (CARAFATE ) 1 g tablet Take 1 tablet (1 g total) by mouth in the morning, at noon, and at bedtime. Dissolve tablet in 15-30 ml of water  and drink slurry. 90 tablet 0   traZODone  (DESYREL) 50 MG tablet Take 50 mg by mouth at bedtime.     zonisamide (ZONEGRAN) 50 MG capsule Take 100 mg by mouth at bedtime.     No current facility-administered medications for this visit.    Allergies as of 02/17/2024 - Review Complete 12/29/2023  Allergen Reaction Noted   Codeine  02/27/2016    Family History  Problem Relation Age of Onset   Heart disease Mother    Heart disease Father    Skin cancer Brother    Heart disease Brother    Colon cancer Maternal Uncle    Diabetes Maternal Uncle    Diabetes Maternal Aunt    Diabetes Maternal Grandmother    Esophageal cancer Neg Hx    Stomach cancer Neg Hx    Rectal cancer Neg Hx     Social History   Socioeconomic History   Marital status: Married    Spouse name: Not on file   Number of children: 1   Years of education: Not on file   Highest education level: Not on file  Occupational History   Occupation: retired  Tobacco Use   Smoking status: Never   Smokeless tobacco: Never  Vaping Use   Vaping status: Never Used  Substance and Sexual Activity   Alcohol use: Yes    Alcohol/week: 0.0 standard drinks of alcohol    Comment: rare   Drug use: No   Sexual activity: Not Currently    Partners: Male  Other Topics Concern   Not on file  Social History Narrative   Right handed      Lives with husband in two story home. Stays on first floor      2 years of college edu      Drinks Caffeine. Coffee and Tea   Social Drivers of Corporate investment banker Strain: Not on file  Food Insecurity: Not on file  Transportation Needs: Not on file  Physical Activity: Not on file  Stress: Not on file  Social Connections: Not on file    Review of Systems: Gen: Denies fever, chills, anorexia. Denies fatigue, weakness, weight loss.  CV: Denies chest pain, palpitations, syncope, peripheral edema, and claudication. Resp: Denies dyspnea at rest, cough, wheezing, coughing up blood, and pleurisy. GI: Denies vomiting blood,  jaundice, and fecal incontinence.   Denies dysphagia or odynophagia. Derm: Denies rash, itching, dry skin Psych: Denies depression, anxiety, memory loss, confusion. No homicidal or suicidal ideation.  Heme: Denies bruising, bleeding, and enlarged lymph nodes.  Physical Exam: There were no vitals taken for this visit. General:   Alert and oriented. No distress noted. Pleasant and cooperative.  Head:  Normocephalic and atraumatic. Eyes:  Conjuctiva clear without scleral icterus. Heart:  S1, S2 present without murmurs appreciated. Lungs:  Clear to auscultation bilaterally. No wheezes, rales, or rhonchi. No distress.  Abdomen:  +BS, soft, non-tender and  non-distended. No rebound or guarding. No HSM or masses noted. Msk:  Symmetrical without gross deformities. Normal posture. Extremities:  Without edema. Neurologic:  Alert and  oriented x4 Psych:  Normal mood and affect.    Assessment:     Plan:  ***   Shana Daring, PA-C [facility] 02/17/2024

## 2024-02-17 ENCOUNTER — Ambulatory Visit: Admitting: Gastroenterology

## 2024-02-17 ENCOUNTER — Encounter: Payer: Self-pay | Admitting: Gastroenterology

## 2024-02-17 VITALS — BP 132/79 | HR 75 | Temp 97.4°F | Ht 62.0 in | Wt 130.4 lb

## 2024-02-17 DIAGNOSIS — K644 Residual hemorrhoidal skin tags: Secondary | ICD-10-CM | POA: Diagnosis not present

## 2024-02-17 DIAGNOSIS — K219 Gastro-esophageal reflux disease without esophagitis: Secondary | ICD-10-CM | POA: Diagnosis not present

## 2024-02-17 DIAGNOSIS — K625 Hemorrhage of anus and rectum: Secondary | ICD-10-CM | POA: Diagnosis not present

## 2024-02-17 DIAGNOSIS — R1013 Epigastric pain: Secondary | ICD-10-CM | POA: Diagnosis not present

## 2024-02-17 MED ORDER — PANTOPRAZOLE SODIUM 40 MG PO TBEC: 40.0000 mg | DELAYED_RELEASE_TABLET | Freq: Every day | ORAL | 3 refills | Status: DC

## 2024-02-17 NOTE — Patient Instructions (Signed)
 As you are still having reflux about twice a week, I would like you to stop famotidine  and Carafate  and start pantoprazole  40 mg once a day, 30 minutes before breakfast.  I have sent a prescription to your pharmacy.  Follow a GERD diet:  Avoid fried, fatty, greasy, spicy, citrus foods. Avoid caffeine and carbonated beverages. Avoid chocolate. Try eating 4-6 small meals a day rather than 3 large meals. Do not eat within 3 hours of laying down. Prop head of bed up on wood or bricks to create a 6 inch incline.   If you continue to have any breakthrough GERD symptoms, please keep a log of when this occurs and what you have eaten prior to see if we can find specific dietary triggers.    I will plan to see you back in 3 months or sooner if needed.  It was good to see you again today!  Shana Daring, PA-C [facility]

## 2024-05-17 ENCOUNTER — Ambulatory Visit: Admitting: Gastroenterology

## 2024-06-19 ENCOUNTER — Other Ambulatory Visit: Payer: Self-pay | Admitting: Gastroenterology

## 2024-06-19 DIAGNOSIS — R1013 Epigastric pain: Secondary | ICD-10-CM

## 2024-06-19 DIAGNOSIS — K219 Gastro-esophageal reflux disease without esophagitis: Secondary | ICD-10-CM

## 2024-06-25 ENCOUNTER — Telehealth: Payer: Self-pay | Admitting: *Deleted

## 2024-06-25 DIAGNOSIS — K219 Gastro-esophageal reflux disease without esophagitis: Secondary | ICD-10-CM

## 2024-06-25 DIAGNOSIS — R1013 Epigastric pain: Secondary | ICD-10-CM

## 2024-06-25 NOTE — Telephone Encounter (Signed)
 Pt needs a refill on pantoprazole . Last OV 06/19/2024

## 2024-06-26 MED ORDER — PANTOPRAZOLE SODIUM 40 MG PO TBEC: 40.0000 mg | DELAYED_RELEASE_TABLET | Freq: Every day | ORAL | 3 refills | Status: AC

## 2024-06-26 NOTE — Telephone Encounter (Signed)
 Completed.

## 2024-06-26 NOTE — Addendum Note (Signed)
 Addended by: SHIRLEAN THERISA ORN on: 06/26/2024 01:13 PM   Modules accepted: Orders

## 2024-06-27 NOTE — Telephone Encounter (Signed)
 Noted
